# Patient Record
Sex: Female | Born: 1950
Health system: Southern US, Community
[De-identification: ages and names within clinical notes are randomized; demographics above are authoritative.]

## PROBLEM LIST (undated history)

## (undated) DIAGNOSIS — K219 Gastro-esophageal reflux disease without esophagitis: Secondary | ICD-10-CM

## (undated) DIAGNOSIS — D649 Anemia, unspecified: Secondary | ICD-10-CM

## (undated) HISTORY — PX: CHOLECYSTECTOMY: SHX55

## (undated) HISTORY — PX: TONSILLECTOMY: SUR1361

## (undated) HISTORY — DX: Gastro-esophageal reflux disease without esophagitis: K21.9

## (undated) HISTORY — PX: APPENDECTOMY: SHX54

## (undated) HISTORY — PX: FUNCTIONAL ENDOSCOPIC SINUS SURGERY: SUR616

## (undated) HISTORY — DX: Anemia, unspecified: D64.9

---

## 1998-07-18 ENCOUNTER — Other Ambulatory Visit: Admission: RE | Admit: 1998-07-18 | Discharge: 1998-07-18 | Payer: Self-pay | Admitting: *Deleted

## 1999-06-30 ENCOUNTER — Other Ambulatory Visit: Admission: RE | Admit: 1999-06-30 | Discharge: 1999-06-30 | Payer: Self-pay | Admitting: *Deleted

## 2000-05-20 ENCOUNTER — Other Ambulatory Visit: Admission: RE | Admit: 2000-05-20 | Discharge: 2000-05-20 | Payer: Self-pay | Admitting: *Deleted

## 2000-06-13 ENCOUNTER — Encounter: Payer: Self-pay | Admitting: *Deleted

## 2000-06-13 ENCOUNTER — Encounter: Admission: RE | Admit: 2000-06-13 | Discharge: 2000-06-13 | Payer: Self-pay | Admitting: *Deleted

## 2001-05-19 ENCOUNTER — Encounter: Admission: RE | Admit: 2001-05-19 | Discharge: 2001-05-19 | Payer: Self-pay | Admitting: *Deleted

## 2001-05-19 ENCOUNTER — Encounter: Payer: Self-pay | Admitting: *Deleted

## 2001-06-05 ENCOUNTER — Other Ambulatory Visit: Admission: RE | Admit: 2001-06-05 | Discharge: 2001-06-05 | Payer: Self-pay | Admitting: *Deleted

## 2002-05-25 ENCOUNTER — Encounter: Admission: RE | Admit: 2002-05-25 | Discharge: 2002-05-25 | Payer: Self-pay | Admitting: *Deleted

## 2002-05-25 ENCOUNTER — Encounter: Payer: Self-pay | Admitting: *Deleted

## 2002-06-12 ENCOUNTER — Other Ambulatory Visit: Admission: RE | Admit: 2002-06-12 | Discharge: 2002-06-12 | Payer: Self-pay | Admitting: Obstetrics and Gynecology

## 2003-05-23 ENCOUNTER — Encounter: Payer: Self-pay | Admitting: Obstetrics and Gynecology

## 2003-05-23 ENCOUNTER — Encounter: Admission: RE | Admit: 2003-05-23 | Discharge: 2003-05-23 | Payer: Self-pay | Admitting: Obstetrics and Gynecology

## 2003-07-10 ENCOUNTER — Other Ambulatory Visit: Admission: RE | Admit: 2003-07-10 | Discharge: 2003-07-10 | Payer: Self-pay | Admitting: Obstetrics and Gynecology

## 2004-05-26 ENCOUNTER — Encounter: Admission: RE | Admit: 2004-05-26 | Discharge: 2004-05-26 | Payer: Self-pay | Admitting: Obstetrics and Gynecology

## 2005-06-14 ENCOUNTER — Encounter: Admission: RE | Admit: 2005-06-14 | Discharge: 2005-06-14 | Payer: Self-pay | Admitting: Obstetrics and Gynecology

## 2006-05-02 ENCOUNTER — Encounter: Admission: RE | Admit: 2006-05-02 | Discharge: 2006-05-02 | Payer: Self-pay | Admitting: Obstetrics and Gynecology

## 2006-06-15 ENCOUNTER — Encounter: Admission: RE | Admit: 2006-06-15 | Discharge: 2006-06-15 | Payer: Self-pay | Admitting: Obstetrics and Gynecology

## 2006-07-12 ENCOUNTER — Ambulatory Visit: Payer: Self-pay | Admitting: Internal Medicine

## 2006-07-12 LAB — CONVERTED CEMR LAB
ALT: 18 units/L (ref 0–40)
Basophils Relative: 0.8 % (ref 0.0–1.0)
Bilirubin Urine: NEGATIVE
CO2: 27 meq/L (ref 19–32)
Calcium: 9.4 mg/dL (ref 8.4–10.5)
Chloride: 105 meq/L (ref 96–112)
Chol/HDL Ratio, serum: 3.9
Cholesterol: 201 mg/dL (ref 0–200)
Creatinine, Ser: 0.8 mg/dL (ref 0.4–1.2)
Glomerular Filtration Rate, Af Am: 96 mL/min/{1.73_m2}
Glucose, Bld: 91 mg/dL (ref 70–99)
HDL: 51.8 mg/dL (ref >39.0)
Hemoglobin, Urine: NEGATIVE
Ketones, ur: NEGATIVE mg/dL
Lymphocytes Relative: 31.5 % (ref 12.0–46.0)
MCHC: 33.6 g/dL (ref 30.0–36.0)
MCV: 89 fL (ref 78.0–100.0)
Monocytes Absolute: 0.4 10*3/uL (ref 0.2–0.7)
Neutro Abs: 3.7 10*3/uL (ref 1.4–7.7)
Nitrite: NEGATIVE
Platelets: 285 10*3/uL (ref 150–400)
Specific Gravity, Urine: <=1.005 (ref 1.000–1.03)
TSH: 2.33 microintl units/mL (ref 0.35–5.50)
Total Protein, Urine: NEGATIVE mg/dL
Urine Glucose: NEGATIVE mg/dL
Urobilinogen, UA: 0.2 (ref 0.0–1.0)
VLDL: 18 mg/dL (ref 0–40)
WBC: 6.5 10*3/uL (ref 4.5–10.5)
pH: 6 (ref 5.0–8.0)

## 2006-07-19 ENCOUNTER — Ambulatory Visit: Payer: Self-pay | Admitting: Internal Medicine

## 2006-07-28 ENCOUNTER — Ambulatory Visit: Payer: Self-pay | Admitting: Internal Medicine

## 2006-08-23 ENCOUNTER — Ambulatory Visit: Payer: Self-pay | Admitting: Internal Medicine

## 2006-08-23 HISTORY — PX: COLONOSCOPY: SHX174

## 2006-09-16 ENCOUNTER — Ambulatory Visit: Payer: Self-pay | Admitting: Internal Medicine

## 2006-10-17 ENCOUNTER — Ambulatory Visit: Payer: Self-pay | Admitting: Internal Medicine

## 2007-04-14 ENCOUNTER — Encounter: Admission: RE | Admit: 2007-04-14 | Discharge: 2007-04-14 | Payer: Self-pay | Admitting: Obstetrics and Gynecology

## 2007-06-09 ENCOUNTER — Ambulatory Visit: Payer: Self-pay | Admitting: Internal Medicine

## 2007-07-20 ENCOUNTER — Ambulatory Visit: Payer: Self-pay | Admitting: Internal Medicine

## 2007-07-20 LAB — CONVERTED CEMR LAB
ALT: 18 units/L (ref 0–35)
Alkaline Phosphatase: 66 units/L (ref 39–117)
BUN: 10 mg/dL (ref 6–23)
Basophils Relative: 0.7 % (ref 0.0–1.0)
Bilirubin Urine: NEGATIVE
CO2: 29 meq/L (ref 19–32)
Calcium: 9.7 mg/dL (ref 8.4–10.5)
Creatinine, Ser: 0.7 mg/dL (ref 0.4–1.2)
GFR calc Af Amer: 112 mL/min
HDL: 56.8 mg/dL (ref >39.0)
Monocytes Relative: 5.2 % (ref 3.0–11.0)
Platelets: 302 10*3/uL (ref 150–400)
RBC: 4.39 M/uL (ref 3.87–5.11)
RDW: 12.1 % (ref 11.5–14.6)
Specific Gravity, Urine: <=1.005 (ref 1.000–1.03)
Total Bilirubin: 0.9 mg/dL (ref 0.3–1.2)
Total Protein, Urine: NEGATIVE mg/dL
Total Protein: 7.7 g/dL (ref 6.0–8.3)
Triglycerides: 117 mg/dL (ref 0–149)
Urine Glucose: NEGATIVE mg/dL
VLDL: 23 mg/dL (ref 0–40)
pH: 6.5 (ref 5.0–8.0)

## 2007-07-26 ENCOUNTER — Encounter: Payer: Self-pay | Admitting: Internal Medicine

## 2007-07-26 ENCOUNTER — Ambulatory Visit: Payer: Self-pay | Admitting: Internal Medicine

## 2007-07-26 DIAGNOSIS — M1990 Unspecified osteoarthritis, unspecified site: Secondary | ICD-10-CM | POA: Insufficient documentation

## 2007-07-26 DIAGNOSIS — L570 Actinic keratosis: Secondary | ICD-10-CM | POA: Insufficient documentation

## 2007-07-26 DIAGNOSIS — L821 Other seborrheic keratosis: Secondary | ICD-10-CM | POA: Insufficient documentation

## 2007-08-22 ENCOUNTER — Encounter: Payer: Self-pay | Admitting: Internal Medicine

## 2008-01-04 ENCOUNTER — Encounter: Payer: Self-pay | Admitting: Internal Medicine

## 2008-01-16 ENCOUNTER — Encounter: Payer: Self-pay | Admitting: Internal Medicine

## 2008-04-12 ENCOUNTER — Encounter: Admission: RE | Admit: 2008-04-12 | Discharge: 2008-04-12 | Payer: Self-pay | Admitting: Internal Medicine

## 2008-09-03 ENCOUNTER — Ambulatory Visit: Payer: Self-pay | Admitting: Internal Medicine

## 2008-09-03 LAB — CONVERTED CEMR LAB
AST: 19 units/L (ref 0–37)
Alkaline Phosphatase: 54 units/L (ref 39–117)
Basophils Absolute: 0 10*3/uL (ref 0.0–0.1)
Bilirubin Urine: NEGATIVE
Chloride: 102 meq/L (ref 96–112)
Cholesterol: 156 mg/dL (ref 0–200)
Eosinophils Absolute: 0.3 10*3/uL (ref 0.0–0.7)
Eosinophils Relative: 4.3 % (ref 0.0–5.0)
GFR calc Af Amer: 133 mL/min
GFR calc non Af Amer: 110 mL/min
Hemoglobin, Urine: NEGATIVE
LDL Cholesterol: 77 mg/dL (ref 0–99)
MCHC: 33.8 g/dL (ref 30.0–36.0)
MCV: 89.3 fL (ref 78.0–100.0)
Mucus, UA: NEGATIVE
Neutrophils Relative %: 60.6 % (ref 43.0–77.0)
Platelets: 259 10*3/uL (ref 150–400)
Potassium: 4.4 meq/L (ref 3.5–5.1)
RBC / HPF: NONE SEEN
RDW: 11.8 % (ref 11.5–14.6)
TSH: 1.92 microintl units/mL (ref 0.35–5.50)
Total Bilirubin: 0.6 mg/dL (ref 0.3–1.2)
Urine Glucose: NEGATIVE mg/dL
Urobilinogen, UA: 0.2 (ref 0.0–1.0)
VLDL: 33 mg/dL (ref 0–40)
WBC: 6.1 10*3/uL (ref 4.5–10.5)

## 2008-09-06 ENCOUNTER — Telehealth: Payer: Self-pay | Admitting: Internal Medicine

## 2008-09-09 ENCOUNTER — Encounter: Payer: Self-pay | Admitting: Internal Medicine

## 2008-09-18 ENCOUNTER — Ambulatory Visit: Payer: Self-pay | Admitting: Internal Medicine

## 2008-09-18 DIAGNOSIS — N3090 Cystitis, unspecified without hematuria: Secondary | ICD-10-CM | POA: Insufficient documentation

## 2009-03-21 ENCOUNTER — Encounter: Admission: RE | Admit: 2009-03-21 | Discharge: 2009-03-21 | Payer: Self-pay | Admitting: Obstetrics and Gynecology

## 2009-04-07 ENCOUNTER — Ambulatory Visit: Payer: Self-pay | Admitting: Internal Medicine

## 2009-04-08 LAB — CONVERTED CEMR LAB
ALT: 22 units/L (ref 0–35)
AST: 19 units/L (ref 0–37)
Alkaline Phosphatase: 57 units/L (ref 39–117)
BUN: 13 mg/dL (ref 6–23)
Basophils Absolute: 0 10*3/uL (ref 0.0–0.1)
Bilirubin Urine: NEGATIVE
Bilirubin, Direct: 0.1 mg/dL (ref 0.0–0.3)
CO2: 31 meq/L (ref 19–32)
Chloride: 106 meq/L (ref 96–112)
Creatinine, Ser: 0.7 mg/dL (ref 0.4–1.2)
Direct LDL: 139 mg/dL
Eosinophils Relative: 4.6 % (ref 0.0–5.0)
HCT: 39.3 % (ref 36.0–46.0)
Ketones, ur: NEGATIVE mg/dL
Lymphocytes Relative: 29.8 % (ref 12.0–46.0)
Lymphs Abs: 2.2 10*3/uL (ref 0.7–4.0)
Monocytes Relative: 5.9 % (ref 3.0–12.0)
Platelets: 258 10*3/uL (ref 150.0–400.0)
Potassium: 4.5 meq/L (ref 3.5–5.1)
RDW: 11.9 % (ref 11.5–14.6)
Specific Gravity, Urine: 1.025 (ref 1.000–1.030)
TSH: 2.48 microintl units/mL (ref 0.35–5.50)
Total Bilirubin: 0.6 mg/dL (ref 0.3–1.2)
Total CHOL/HDL Ratio: 3
Total Protein, Urine: NEGATIVE mg/dL
Triglycerides: 71 mg/dL (ref 0.0–149.0)
Urine Glucose: NEGATIVE mg/dL
VLDL: 14.2 mg/dL (ref 0.0–40.0)
WBC: 7.4 10*3/uL (ref 4.5–10.5)
pH: 6 (ref 5.0–8.0)

## 2009-04-09 ENCOUNTER — Ambulatory Visit: Payer: Self-pay | Admitting: Internal Medicine

## 2009-04-09 DIAGNOSIS — K219 Gastro-esophageal reflux disease without esophagitis: Secondary | ICD-10-CM

## 2009-04-14 ENCOUNTER — Telehealth: Payer: Self-pay | Admitting: Internal Medicine

## 2009-09-01 ENCOUNTER — Telehealth: Payer: Self-pay | Admitting: Internal Medicine

## 2010-01-28 ENCOUNTER — Encounter: Payer: Self-pay | Admitting: Internal Medicine

## 2010-04-17 ENCOUNTER — Encounter: Admission: RE | Admit: 2010-04-17 | Discharge: 2010-04-17 | Payer: Self-pay | Admitting: Obstetrics and Gynecology

## 2010-04-21 ENCOUNTER — Telehealth: Payer: Self-pay | Admitting: Internal Medicine

## 2010-04-21 DIAGNOSIS — R61 Generalized hyperhidrosis: Secondary | ICD-10-CM

## 2010-04-29 ENCOUNTER — Ambulatory Visit: Payer: Self-pay | Admitting: Internal Medicine

## 2010-04-29 LAB — CONVERTED CEMR LAB
ALT: 16 units/L (ref 0–35)
AST: 19 units/L (ref 0–37)
Albumin: 4.4 g/dL (ref 3.5–5.2)
Bilirubin Urine: NEGATIVE
CO2: 30 meq/L (ref 19–32)
Calcium: 9.4 mg/dL (ref 8.4–10.5)
Creatinine, Ser: 0.7 mg/dL (ref 0.4–1.2)
Eosinophils Relative: 2.5 % (ref 0.0–5.0)
FSH: 57.2 milliintl units/mL
GFR calc non Af Amer: 94.53 mL/min (ref >60)
HCT: 38.2 % (ref 36.0–46.0)
HDL: 55 mg/dL (ref >39.00)
Hemoglobin: 12.9 g/dL (ref 12.0–15.0)
Ketones, ur: NEGATIVE mg/dL
Lymphs Abs: 2.1 10*3/uL (ref 0.7–4.0)
MCV: 89.3 fL (ref 78.0–100.0)
Monocytes Absolute: 0.3 10*3/uL (ref 0.1–1.0)
Monocytes Relative: 5.2 % (ref 3.0–12.0)
Neutro Abs: 3.9 10*3/uL (ref 1.4–7.7)
RDW: 12.9 % (ref 11.5–14.6)
Sodium: 141 meq/L (ref 135–145)
TSH: 1.44 microintl units/mL (ref 0.35–5.50)
Total CHOL/HDL Ratio: 3
Triglycerides: 77 mg/dL (ref 0.0–149.0)
Urine Glucose: NEGATIVE mg/dL
Urobilinogen, UA: 0.2 (ref 0.0–1.0)
WBC: 6.6 10*3/uL (ref 4.5–10.5)

## 2010-05-05 ENCOUNTER — Ambulatory Visit: Payer: Self-pay | Admitting: Internal Medicine

## 2010-10-27 NOTE — Letter (Signed)
Summary: Immunization/Shot Record  [facility]  [address] [place], [place] [address]   Phone: [phone]  Fax: [phone]     Immunization Record for: [name] [facility]  Vaccine 1 2 3 4 5 6  HepB Hepatitis B                    DTP Diphtheria, Tetanus, Pertussis                         HIB Haemophilus influenzae Type b                 IONGEXBMWU IPV Inactivated Poliovirus             MMR Measles, Mumps, Jeanella Craze XLKGMWNUUV OZDGUYQIHK Varicella Varivax    VQQVZDGLOV FIEPPIRJJO ACZYSAYTKZ Pneumococcal           Hep A Hepatitis A   SWFUXNATFT DDUKGURKYH CWCBJSEGBT DVVOHYWVPX        Tetanus Booster Date of Last: Td 04/09/2009  Flu Shot Date of Last: 08/01/2008 Pneumovax Date of Last:  Meningococcal Vaccine Given:       Other Vaccines HPV Vaccine/ Date of Last:    Vaccine/ Date of Last:    Vaccine/ Date of Last:     [name]  TGGYIRSWNI  OEVOJJKKXF Rotavirus Vaccine/ Date of Last:    Vaccine/ Date of Last:    Vaccine/ Date of Last:     GHWEXHBZJI  [name]  RCVELFYBOF Zostavax Vaccine/ Date of Last:     [name]  BPZWCHENID  [name]  POEUMPNTIR  WERXVQMGQQ  Recommended Childhood and Adolescent Immunization Schedule [place]  2006 Vaccine Age Birth 1 mos 2 mos 4 mos 6 mos 12 mos 15 mos 18 mos 24 mos 4-6 yrs 11-12 yrs 13-14 yrs 15 yrs 16-18 yrs Hepatitis B1 HepB HepB HepB1  HepB  HepB Series Catch-Up Diphtheria, Tetanus, Pertussis2   DTaP DTaP DTaP   DTaP  DTaP Tdap  Tdap Catch-Up Haemophilus influenzae type b3   Hib Hib Hib3 Hib        Inactivated Poliovirus   IPV IPV  IPV   IPV     Measles, Mumps, Rubella4      MMR   MMR M MR MMR Catch-Up Varicella5       Varicella  Varicella  Catch-Up Meningo-coccal6           MCV4  MCV4 CatchUpV4           MPSV4 for High Risk Groups  C MCV4 for High Risk Groups Pneumo-coccal7   PCV PCV PCV PCV   PCV  Catch-Up PPV for High Risk Groups         PPV for High Risk Groups  Influenza8      Influenza (Yearly)  Influenza (Yearly) for High Risk Groups Hepatitis A9       HepA Series  This schedule indicates the recommended ages for routine administration of currently licensed childhood vaccines, as of August 27, 2004, for children through age 46 years. Any dose not administered at the recommended age should be administered at any subsequent visit when indicated and feasible. Indicates age groups that warrant special effort to administer those vaccines not previously administered. Additional vaccines may be licensed and recommended during the year. Licensed combination vaccines may be used whenever any components of the combination are indicated and other components of the vaccine are not contraindicated and if approved by the Food and Drug  Administration for that dose of the series. Providers should consult the respective ACIP statement for detailed recommendations. Clinically significant adverse events that follow immunization should be reported to the Vaccine Adverse Event Reporting System (VAERS). Guidance about how to obtain and complete a VAERS form is available at www.vaers.LAgents.no or by telephone, [phone].  The Childhood and Adolescent Immunization Schedule is approved by: Advisory Committee on Administrator http://www.wade.com/   [name] BridgeDigest.com.cy   American Academy [employer].aafp.org

## 2010-10-27 NOTE — Progress Notes (Signed)
Summary: Lab request  Phone Note Call from Patient   Caller: Patient Summary of Call: pt has appt 05-05-10.  She is scheduled to have labs drawn prior and wants FSH drawn at that time.  ok to add to lab order? Initial call taken by: Lanier Prude, [facility]),  April 21, 2010 8:21 AM  Follow-up for Phone Call        ok add [facility] Follow-up by: Tresa Garter MD,  April 21, 2010 12:58 PM  Additional Follow-up for Phone Call Additional follow up Details #1::        Can you help w/dx please? Additional Follow-up by: Lamar Sprinkles, CMA,  April 21, 2010 5:55 PM  New Problems: SWEATING (ICD-780.8)   Additional Follow-up for Phone Call Additional follow up Details #2::    780.8 Follow-up by: Tresa Garter MD,  April 21, 2010 5:58 PM  Additional Follow-up for Phone Call Additional follow up Details #3:: Details for Additional Follow-up Action Taken: Diagnosis code added to lab order for [facility]. Additional Follow-up by: Verdell Face,  April 22, 2010 8:46 AM  New Problems: SWEATING (ICD-780.8)

## 2010-10-27 NOTE — Assessment & Plan Note (Signed)
Summary: cpx/#/cd   Vital Signs:  Patient profile:   60 year old female Height:      64 inches Weight:      166 pounds BMI:     28.60 O2 Sat:      96 % on Room air Temp:     98.4 degrees F oral Pulse rate:   73 / minute Pulse rhythm:   regular Resp:     16 per minute BP sitting:   138 / 80  (left arm) Cuff size:   regular  Vitals Entered By: Lanier Prude, CMA(AAMA) (May 05, 2010 2:16 PM)  O2 Flow:  Room air CC: CPX Is Patient Diabetic? No   CC:  CPX.  History of Present Illness: The patient presents for a preventive health examination   Current Medications (verified): 1)  Ibuprofen 600 Mg  Tabs (Ibuprofen) .Marland Kitchen.. 1 Two Times A Day Prn 2)  Ranitidine Hcl 150 Mg Caps (Ranitidine Hcl) .... Bid 3)  Vitamin D3 1000 Unit  Tabs (Cholecalciferol) .Marland Kitchen.. 1 Qd 4)  Triamcinolone Acetonide 0.1 % Oint (Triamcinolone Acetonide) .... Use 1-2 Times A Day 5)  Diflucan 150 Mg Tabs (Fluconazole) .Marland Kitchen.. 1 Once Daily Prn 6)  Triamcinolone Acetonide 0.5% Cream in Eucerin Lotion 1:10 .... Apply Bid To Affected Area 7)  Diflucan 150 Mg Tabs (Fluconazole) .Marland Kitchen.. 1 Po Once Daily  Prn 8)  Promethazine Hcl 25 Mg  Tabs (Promethazine Hcl) .Marland Kitchen.. 1 By Mouth Qid Prn  Allergies (verified): 1)  ! Ceclor  Past History:  Past Medical History: Last updated: 04/09/2009 Osteoarthritis B knee injury GYN: Dr Rosemary Holms Hand dermatitis GERD  Past Surgical History: Last updated: 07/26/2007 Appendectomy Cholecystectomy  Family History: Last updated: 04/21/2009 Family History Diabetes 1st degree relative Family History Osteoporosis  Social History: Last updated: 04/21/2009 Occupation: Married Maralyn Sago got married in 2010  Review of Systems  The patient denies anorexia, fever, weight loss, weight gain, vision loss, decreased hearing, hoarseness, chest pain, syncope, dyspnea on exertion, peripheral edema, prolonged cough, headaches, hemoptysis, abdominal pain, melena, hematochezia, severe  indigestion/heartburn, hematuria, incontinence, genital sores, muscle weakness, suspicious skin lesions, transient blindness, difficulty walking, depression, unusual weight change, abnormal bleeding, enlarged lymph nodes, angioedema, and breast masses.    Physical Exam  General:  Well-developed,well-nourished,in no acute distress; alert,appropriate and cooperative throughout examination Head:  Normocephalic and atraumatic without obvious abnormalities. No apparent alopecia or balding. Eyes:  No corneal or conjunctival inflammation noted. EOMI. Perrla. Ears:  External ear exam shows no significant lesions or deformities.  Otoscopic examination reveals clear canals, tympanic membranes are intact bilaterally without bulging, retraction, inflammation or discharge. Hearing is grossly normal bilaterally. Nose:  External nasal examination shows no deformity or inflammation. Nasal mucosa are pink and moist without lesions or exudates. Mouth:  Oral mucosa and oropharynx without lesions or exudates.  Teeth in good repair. Neck:  No deformities, masses, or tenderness noted. Chest Wall:  No deformities, masses, or tenderness noted. Lungs:  Normal respiratory effort, chest expands symmetrically. Lungs are clear to auscultation, no crackles or wheezes. Heart:  Normal rate and regular rhythm. S1 and S2 normal without gallop, murmur, click, rub or other extra sounds. Abdomen:  Bowel sounds positive,abdomen soft and non-tender without masses, organomegaly or hernias noted. Msk:  No deformity or scoliosis noted of thoracic or lumbar spine.   Pulses:  R and L carotid,radial,femoral,dorsalis pedis and posterior tibial pulses are full and equal bilaterally Extremities:  No clubbing, cyanosis, edema, or deformity noted with normal full range of  motion of all joints.   Neurologic:  No cranial nerve deficits noted. Station and gait are normal. Plantar reflexes are down-going bilaterally. DTRs are symmetrical throughout.  Sensory, motor and coordinative functions appear intact. Skin:  Intact without suspicious lesions or rashes Cervical Nodes:  No lymphadenopathy noted Inguinal Nodes:  No significant adenopathy Psych:  Cognition and judgment appear intact. Alert and cooperative with normal attention span and concentration. No apparent delusions, illusions, hallucinations   Impression & Recommendations:  Problem # 1:  PHYSICAL EXAMINATION (ICD-V70.0) Assessment New Health and age related issues were discussed. Available screening tests and vaccinations were discussed as well. Healthy life style including good diet and exercise was discussed. The labs were reviewed with the patient.  Orders: EKG w/ Interpretation (93000)OK GYN q 12 months   Problem # 2:  OSTEOARTHRITIS (ICD-715.90) knees Assessment: Unchanged  Her updated medication list for this problem includes:    Ibuprofen 600 Mg Tabs (Ibuprofen) .Marland Kitchen... 1 two times a day prn  Problem # 3:  Travel to [place] Assessment: New Prescriptions and advice were given Vaccines up to date  Complete Medication List: 1)  Ibuprofen 600 Mg Tabs (Ibuprofen) .Marland Kitchen.. 1 two times a day prn 2)  Ranitidine Hcl 150 Mg Caps (Ranitidine hcl) .... Bid 3)  Vitamin D3 1000 Unit Tabs (Cholecalciferol) .Marland Kitchen.. 1 qd 4)  Diflucan 150 Mg Tabs (Fluconazole) .Marland Kitchen.. 1 once daily prn 5)  Triamcinolone Acetonide 0.5 % Crea (Triamcinolone acetonide) .... Use two times a day prn 6)  Ciprofloxacin Hcl 500 Mg Tabs (Ciprofloxacin hcl) .Marland Kitchen.. 1 by mouth bid 7)  Diflucan 150 Mg Tabs (Fluconazole) .Marland Kitchen.. 1 by mouth once daily once for yeast infection  Patient Instructions: 1)  Please schedule a follow-up appointment in 1 year. Prescriptions: CIPROFLOXACIN HCL 500 MG TABS (CIPROFLOXACIN HCL) 1 by mouth bid  #20 x 1   Entered and Authorized by:   Tresa Garter MD   Signed by:   Tresa Garter MD on 05/05/2010   Method used:   Print then Give to Patient   RxID:    [id number] DIFLUCAN 150 MG TABS (FLUCONAZOLE) 1 by mouth once daily once for yeast infection  #3 x 1   Entered and Authorized by:   Tresa Garter MD   Signed by:   Tresa Garter MD on 05/05/2010   Method used:   Print then Give to Patient   RxID:   [id number] RANITIDINE HCL 150 MG CAPS (RANITIDINE HCL) bid  #60 x 10   Entered and Authorized by:   Tresa Garter MD   Signed by:   Tresa Garter MD on 05/05/2010   Method used:   Electronically to        [facility]* (retail)       [address]       [place], [place]  [address]       Ph: [phone]       Fax: [phone]   RxID:   [id number] IBUPROFEN 600 MG  TABS (IBUPROFEN) 1 two times a day prn  #120 x 1   Entered and Authorized by:   Tresa Garter MD   Signed by:   Tresa Garter MD on 05/05/2010   Method used:   Electronically to        [facility]* (retail)       [address]       [place], [place]  [address]  Ph: [phone]       Fax: [phone]   RxID:   [id number] TRIAMCINOLONE ACETONIDE 0.5 % CREA (TRIAMCINOLONE ACETONIDE) use two times a day prn  #45 g x 3   Entered and Authorized by:   Tresa Garter MD   Signed by:   Tresa Garter MD on 05/05/2010   Method used:   Electronically to        [facility]* (retail)       [address]       [place], [place]  [address]       Ph: [phone]       Fax: [phone]   RxID:   [id number]

## 2011-02-12 NOTE — Assessment & Plan Note (Signed)
[facility]                             PRIMARY CARE OFFICE NOTE   NAME:BUCIORNilam, [name]                       MRN:          [id number]  DATE:07/19/2006                            DOB:          [birth year]    The patient is a 60 year old female who presents for a wellness examination.   PAST MEDICAL HISTORY:  1. Bilateral knee pain, status post injury about 3 years ago; no surgery      was done.  2. History of appendectomy and cholecystectomy.   FAMILY HISTORY:  Mother is living at age of 45, has osteoporosis.  One  sister with asthma.  Daughter with type 1 diabetes.  Father died at age of  66.   SOCIAL HISTORY:  She is married with children, does not smoke.  She has been  staying at home lately.  She is a Systems analyst.   CURRENT MEDICATIONS:  Multivitamins on occasion, ibuprofen as needed.   ALLERGIES:  CEFUROXIME gave her rash.   REVIEW OF SYSTEMS:  Knee pain, not every day.  No chest pain or shortness of  breath.  No blood in the stool.  No neurologic complaints.  The rest is  negative.   PHYSICAL EXAMINATION:  Blood pressure 148/95, pulse 77, temperature 97.1.  Weight 165.  She looks well.  She is in no acute distress.  HEENT:  Moist mucosa.  NECK:  Supple.  No thyromegaly or bruit.  LUNGS:  Clear.  No wheeze or rales.  HEART:  S1 and S2.  No murmur.  No gallop.  ABDOMEN:  Soft and nontender.  No organomegaly and no masses felt.  LOWER EXTREMITIES:  Without edema.  She is alert, oriented and cooperative, denies being depressed.  SKIN:  Clear.  JOINTS:  Without deformities.   LABORATORY DATA:  On July 12, 2006, CBC normal, CMET normal, cholesterol  201, LDL 126, TSH 2.33, urinalysis normal.   ASSESSMENT AND PLAN:  1. Normal wellness examination. Age/health related issues discussed.      Healthy lifestyle discussed.  Regular gynecological care by Dr. Billy Flores.      Received a flu shot and a tetanus shot today.  We will schedule  a      colonoscopy by Dr. Juanda Flores.  Take calcium and vitamin D.  Mammogram and      bone density      scan per Dr. Billy Flores.  2. Knee pain, status post remote injury.  She will use ibuprofen as      needed.            ______________________________  Georgina Quint [name], MD      AVP/MedQ  DD:  07/20/2006  DT:  07/21/2006  Job #:  [id number]   cc:   [name], M.D.  [name]. Katrina Chance, MD

## 2011-02-12 NOTE — Assessment & Plan Note (Signed)
[facility]                                 ON-CALL NOTE   NAME:BUCIORLeola, Fiore                       MRN:          [id number]  DATE:01/05/2008                            DOB:          03/25/1951    PRIMARY CARE PHYSICIAN:  Georgina Quint. [name], M.D.   SUBJECTIVE:  The patient is a going on a trip to [place].  She had the  pharmacy fax prescriptions to the office but it was never responded to  in over a week.  She is now calling to get these before she leaves on  Sunday.  The prescriptions are Cipro, fluconazole, and generic Lomotil.   ASSESSMENT/PLAN:  Discussed this with the pharmacy. They do have these  prescriptions that were never addressed.  I went ahead and okayed one  filling with 0 refills, fluconazole, generic Lomotil, and Cipro for 10  days.     Kerby Nora, MD  Electronically Signed    AB/MedQ  DD: 01/05/2008  DT: 01/05/2008  Job #: [id number]

## 2011-06-08 ENCOUNTER — Other Ambulatory Visit: Payer: Self-pay | Admitting: Obstetrics and Gynecology

## 2011-06-08 DIAGNOSIS — Z1231 Encounter for screening mammogram for malignant neoplasm of breast: Secondary | ICD-10-CM

## 2011-06-09 ENCOUNTER — Ambulatory Visit
Admission: RE | Admit: 2011-06-09 | Discharge: 2011-06-09 | Disposition: A | Discharge status: Home / Self Care | Payer: 59 | Source: Ambulatory Visit | Attending: Obstetrics and Gynecology | Admitting: Obstetrics and Gynecology

## 2011-06-09 ENCOUNTER — Ambulatory Visit: Payer: Self-pay

## 2011-06-09 DIAGNOSIS — Z1231 Encounter for screening mammogram for malignant neoplasm of breast: Secondary | ICD-10-CM

## 2011-06-21 ENCOUNTER — Telehealth: Payer: Self-pay

## 2011-06-21 NOTE — Telephone Encounter (Signed)
[facility] Triage Call Report Triage Record Num: [id number] Operator: Micah Flesher Patient Name: [name] Call Date & Time: 06/19/2011 11:30:42AM Patient Phone: [phone] PCP: Sonda Primes Patient Gender: Female PCP Fax : [phone] Patient DOB: 02/04/1951 Practice Name: [facility] Reason for Call: Calling with poison Ivy/Oak on wrist, face, and on Left foot. Red oozing rash. Onset 06-18-11. Emergent sxs r/o per Poison [place], [place], or [place] Exposure protocol. Home care advice given. Protocol(s) Used: [employer], [place], or [facility] Exposure Recommended Outcome per Protocol: Provide Home/Self Care Reason for Outcome: Itching, burning skin Care Advice: ~ Change into clean clothes as soon as possible. Launder clothes several times before reuse to remove toxic resin. ~ Resting will help avoid overheating and sweating which will intensify the irritation. Apply cool compresses to affected area(s) for 20 minutes 4 to 6 times daily to help relieve itching and provide a topical anesthesia. ~ Calamine is appropriate if used as directed on the label or by a pharmacist. DO NOT use topical preparations with benzocaine because they can be sensitizing agents and can cause an allergic reaction. ~ ~ Call provider if there is no improvement in 5 days (symptoms can last 1-2 weeks). ~ SYMPTOM / CONDITION MANAGEMENT For symptom relief, consider nonprescription antihistamines (such as Allerest, Claritin, Zyrtec, Chlor-Trimetron, Benadryl, etc.) as directed on label or by pharmacist. Drowsiness may result, especially in geriatric patients. Non-sedating antihistamines are available without a prescription. ~ POISON IVY, OAK, AND SUMAC: * Call provider if you develop - an open oozing, painful rash, - signs of secondary infection (e.g. tenderness in the rash area, yellowish drainage, or soft yellow scabs), - fever of 101.5 F (38.6 C) or greater, - increasing involvement of surrounding skin, - or  severe discomfort. * Regardless of temperature, immunocompromised patients (such as diabetes, HIV/AIDS, chemotherapy, organ transplant, or chronic steroid use) must be evaluated by provider. ~ Itching Relief: - Avoid scratching or rubbing irritated area; may cause further irritation and secondary infection - Take cool showers or baths several times a day to relieve itching; do not use soap - If cool water alone does not relieve itching, try adding 1/2 to 1 cup baking soda to bath water - Follow with application of a bland lotion such as calamine (do not apply to the eyes or genitals). ~ 06/19/2011 12:00:04PM Page 1 of 1 [id number]

## 2011-08-29 ENCOUNTER — Other Ambulatory Visit: Payer: Self-pay | Admitting: Internal Medicine

## 2011-08-31 ENCOUNTER — Other Ambulatory Visit: Payer: Self-pay | Admitting: Internal Medicine

## 2011-09-16 ENCOUNTER — Other Ambulatory Visit: Payer: Self-pay | Admitting: *Deleted

## 2012-05-31 ENCOUNTER — Other Ambulatory Visit: Payer: Self-pay | Admitting: Obstetrics and Gynecology

## 2012-05-31 DIAGNOSIS — Z1231 Encounter for screening mammogram for malignant neoplasm of breast: Secondary | ICD-10-CM

## 2012-06-06 ENCOUNTER — Ambulatory Visit
Admission: RE | Admit: 2012-06-06 | Discharge: 2012-06-06 | Disposition: A | Discharge status: Home / Self Care | Payer: 59 | Source: Ambulatory Visit | Attending: Obstetrics and Gynecology | Admitting: Obstetrics and Gynecology

## 2012-06-06 DIAGNOSIS — Z1231 Encounter for screening mammogram for malignant neoplasm of breast: Secondary | ICD-10-CM

## 2012-06-20 ENCOUNTER — Encounter: Payer: Self-pay | Admitting: Internal Medicine

## 2012-06-20 ENCOUNTER — Ambulatory Visit (INDEPENDENT_AMBULATORY_CARE_PROVIDER_SITE_OTHER): Payer: 59 | Admitting: Internal Medicine

## 2012-06-20 ENCOUNTER — Other Ambulatory Visit (INDEPENDENT_AMBULATORY_CARE_PROVIDER_SITE_OTHER): Payer: 59

## 2012-06-20 VITALS — BP 130/90 | HR 76 | Temp 97.7°F | Resp 16 | Ht 65.0 in | Wt 159.0 lb

## 2012-06-20 DIAGNOSIS — K219 Gastro-esophageal reflux disease without esophagitis: Secondary | ICD-10-CM

## 2012-06-20 DIAGNOSIS — M1990 Unspecified osteoarthritis, unspecified site: Secondary | ICD-10-CM

## 2012-06-20 DIAGNOSIS — Z23 Encounter for immunization: Secondary | ICD-10-CM

## 2012-06-20 DIAGNOSIS — Z0000 Encounter for general adult medical examination without abnormal findings: Secondary | ICD-10-CM

## 2012-06-20 LAB — CBC WITH DIFFERENTIAL/PLATELET
Basophils Relative: 0.6 % (ref 0.0–3.0)
Eosinophils Absolute: 0.3 10*3/uL (ref 0.0–0.7)
HCT: 41.9 % (ref 36.0–46.0)
Hemoglobin: 13.6 g/dL (ref 12.0–15.0)
Lymphocytes Relative: 26.6 % (ref 12.0–46.0)
Lymphs Abs: 1.9 10*3/uL (ref 0.7–4.0)
MCHC: 32.6 g/dL (ref 30.0–36.0)
MCV: 90 fl (ref 78.0–100.0)
Monocytes Absolute: 0.5 10*3/uL (ref 0.1–1.0)
Neutro Abs: 4.4 10*3/uL (ref 1.4–7.7)
RBC: 4.66 Mil/uL (ref 3.87–5.11)

## 2012-06-20 LAB — URINALYSIS, ROUTINE W REFLEX MICROSCOPIC
Specific Gravity, Urine: <=1.005 (ref 1.000–1.030)
Total Protein, Urine: NEGATIVE
Urine Glucose: NEGATIVE

## 2012-06-20 LAB — HEPATIC FUNCTION PANEL
Alkaline Phosphatase: 65 U/L (ref 39–117)
Bilirubin, Direct: 0.1 mg/dL (ref 0.0–0.3)
Total Protein: 7.6 g/dL (ref 6.0–8.3)

## 2012-06-20 LAB — LIPID PANEL
Cholesterol: 180 mg/dL (ref 0–200)
LDL Cholesterol: 96 mg/dL (ref 0–99)
Total CHOL/HDL Ratio: 3

## 2012-06-20 LAB — BASIC METABOLIC PANEL
BUN: 10 mg/dL (ref 6–23)
Calcium: 9.6 mg/dL (ref 8.4–10.5)
Chloride: 102 mEq/L (ref 96–112)
Creatinine, Ser: 0.7 mg/dL (ref 0.4–1.2)

## 2012-06-20 MED ORDER — VITAMIN D 1000 UNITS PO TABS: 1000.0000 [IU] | ORAL_TABLET | Freq: Every day | ORAL | Status: AC

## 2012-06-20 NOTE — Assessment & Plan Note (Signed)
Chronic - sporadc

## 2012-06-20 NOTE — Assessment & Plan Note (Signed)
OTC meds 

## 2012-06-20 NOTE — Assessment & Plan Note (Signed)
We discussed age appropriate health related issues, including available/recomended screening tests and vaccinations. We discussed a need for adhering to healthy diet and exercise. Labs/EKG were reviewed/ordered. All questions were answered.   

## 2012-06-20 NOTE — Progress Notes (Signed)
  Subjective:    Patient ID: [name], female    DOB: [birth year], 61 y.o.   MRN: [id number]  HPI  She appears well, in no apparent distress.  Alert and oriented times three, pleasant and cooperative. Vital signs are as documented in vital signs section.  Wt Readings from Last 3 Encounters:  06/20/12 159 lb (72.122 kg)  05/05/10 166 lb (75.297 kg)  04/09/09 156 lb (70.761 kg)   BP Readings from Last 3 Encounters:  06/20/12 130/90  05/05/10 138/80  04/09/09 126/84       Review of Systems  Constitutional: Negative for fever, chills, diaphoresis, activity change, appetite change, fatigue and unexpected weight change.  HENT: Negative for hearing loss, ear pain, congestion, sore throat, sneezing, mouth sores, neck pain, dental problem, voice change, postnasal drip and sinus pressure.   Eyes: Negative for pain and visual disturbance.  Respiratory: Negative for cough, chest tightness, wheezing and stridor.   Cardiovascular: Negative for chest pain, palpitations and leg swelling.  Gastrointestinal: Negative for nausea, vomiting, abdominal pain, blood in stool, abdominal distention and rectal pain.  Genitourinary: Negative for dysuria, hematuria, decreased urine volume, vaginal bleeding, vaginal discharge, difficulty urinating, vaginal pain and menstrual problem.  Musculoskeletal: Negative for back pain, joint swelling and gait problem.  Skin: Negative for color change, rash and wound.  Neurological: Negative for dizziness, tremors, syncope, speech difficulty and light-headedness.  Hematological: Negative for adenopathy.  Psychiatric/Behavioral: Negative for suicidal ideas, hallucinations, behavioral problems, confusion, disturbed wake/sleep cycle, dysphoric mood and decreased concentration. The patient is not hyperactive.        Objective:   Physical Exam  Constitutional: She appears well-developed. No distress.  HENT:  Head: Normocephalic.  Right Ear: External ear  normal.  Left Ear: External ear normal.  Nose: Nose normal.  Mouth/Throat: Oropharynx is clear and moist.  Eyes: Conjunctivae normal are normal. Pupils are equal, round, and reactive to light. Right eye exhibits no discharge. Left eye exhibits no discharge.  Neck: Normal range of motion. Neck supple. No JVD present. No tracheal deviation present. No thyromegaly present.  Cardiovascular: Normal rate, regular rhythm and normal heart sounds.   Pulmonary/Chest: No stridor. No respiratory distress. She has no wheezes.  Abdominal: Soft. Bowel sounds are normal. She exhibits no distension and no mass. There is no tenderness. There is no rebound and no guarding.  Musculoskeletal: She exhibits no edema and no tenderness.  Lymphadenopathy:    She has no cervical adenopathy.  Neurological: She displays normal reflexes. No cranial nerve deficit. She exhibits normal muscle tone. Coordination normal.  Skin: No rash noted. No erythema.  Psychiatric: She has a normal mood and affect. Her behavior is normal. Judgment and thought content normal.    Lab Results  Component Value Date   WBC 6.6 04/29/2010   HGB 12.9 04/29/2010   HCT 38.2 04/29/2010   PLT 273.0 04/29/2010   GLUCOSE 90 04/29/2010   CHOL 143 04/29/2010   TRIG 77.0 04/29/2010   HDL 55.00 04/29/2010   LDLDIRECT 139.0 04/07/2009   LDLCALC 73 04/29/2010   ALT 16 04/29/2010   AST 19 04/29/2010   NA 141 04/29/2010   K 3.9 04/29/2010   CL 101 04/29/2010   CREATININE 0.7 04/29/2010   BUN 12 04/29/2010   CO2 30 04/29/2010   TSH 1.44 04/29/2010         Assessment & Plan:

## 2012-11-20 ENCOUNTER — Telehealth: Payer: Self-pay

## 2012-11-20 NOTE — Telephone Encounter (Signed)
How many days abroad? Thx

## 2012-11-20 NOTE — Telephone Encounter (Signed)
Pt called stating she will be travelling internationally x 4 weeks and is requesting Rxs for Malarone, Cipro, and Diflucan to take incase she becomes ill, please advise.

## 2012-11-21 MED ORDER — CIPROFLOXACIN HCL 500 MG PO TABS: 500.0000 mg | ORAL_TABLET | Freq: Two times a day (BID) | ORAL | Status: DC

## 2012-11-21 MED ORDER — ATOVAQUONE-PROGUANIL HCL 250-100 MG PO TABS: 1.0000 | ORAL_TABLET | Freq: Every day | ORAL | Status: DC

## 2012-11-21 MED ORDER — FLUCONAZOLE 150 MG PO TABS: 150.0000 mg | ORAL_TABLET | Freq: Once | ORAL | Status: DC

## 2012-11-21 NOTE — Telephone Encounter (Signed)
Pt will be out of the country for 4 weeks

## 2012-11-21 NOTE — Telephone Encounter (Signed)
Done. Pt informed.

## 2012-12-14 ENCOUNTER — Other Ambulatory Visit: Payer: Self-pay | Admitting: Internal Medicine

## 2013-01-19 ENCOUNTER — Other Ambulatory Visit: Payer: Self-pay | Admitting: Internal Medicine

## 2013-08-02 ENCOUNTER — Other Ambulatory Visit: Payer: Self-pay

## 2013-11-18 ENCOUNTER — Other Ambulatory Visit: Payer: Self-pay | Admitting: Internal Medicine

## 2014-03-08 ENCOUNTER — Other Ambulatory Visit: Payer: Self-pay | Admitting: Internal Medicine

## 2014-09-18 ENCOUNTER — Other Ambulatory Visit: Payer: Self-pay | Admitting: Internal Medicine

## 2015-05-21 ENCOUNTER — Encounter: Payer: Self-pay | Admitting: Internal Medicine

## 2015-05-21 ENCOUNTER — Ambulatory Visit (INDEPENDENT_AMBULATORY_CARE_PROVIDER_SITE_OTHER): Payer: 59 | Admitting: Internal Medicine

## 2015-05-21 VITALS — BP 178/88 | HR 83 | Wt 162.0 lb

## 2015-05-21 DIAGNOSIS — S0003XA Contusion of scalp, initial encounter: Secondary | ICD-10-CM | POA: Insufficient documentation

## 2015-05-21 DIAGNOSIS — Z7189 Other specified counseling: Secondary | ICD-10-CM

## 2015-05-21 DIAGNOSIS — Z7184 Encounter for health counseling related to travel: Secondary | ICD-10-CM | POA: Insufficient documentation

## 2015-05-21 MED ORDER — FLUCONAZOLE 150 MG PO TABS: ORAL_TABLET | ORAL | Status: DC

## 2015-05-21 MED ORDER — IBUPROFEN 600 MG PO TABS: 600.0000 mg | ORAL_TABLET | Freq: Two times a day (BID) | ORAL | Status: DC | PRN

## 2015-05-21 MED ORDER — ATOVAQUONE-PROGUANIL HCL 250-100 MG PO TABS: ORAL_TABLET | ORAL | Status: DC

## 2015-05-21 MED ORDER — CIPROFLOXACIN HCL 500 MG PO TABS: ORAL_TABLET | ORAL | Status: DC

## 2015-05-21 NOTE — Progress Notes (Signed)
Subjective:  Patient ID: [name], female    DOB: 1951-07-10  Age: 64 y.o. MRN: [id number]  CC: Head Laceration   HPI [name] presents for a head bump check. [name] pulled a weed last night, it snapped and [name] lost her foot and bumped her head against a tree - she bled some. No LOC, no HA, no n/v, no confusion.  Outpatient Prescriptions Prior to Visit  Medication Sig Dispense Refill  . atovaquone-proguanil (MALARONE) 250-100 MG TABS TAKE 1 TABLET BY MOUTH DAILY. (Patient taking differently: TAKE 1 -2 TABLET BY MOUTH BEFORE TRIP, DURING THE TRIP AND 7 DAYS AFTER TRIP.) 35 tablet 0  . ciprofloxacin (CIPRO) 500 MG tablet TAKE 1 TABLET (500 MG TOTAL) BY MOUTH 2 (TWO) TIMES DAILY. 20 tablet 0  . fluconazole (DIFLUCAN) 150 MG tablet TAKE 1 TABLET (150 MG TOTAL) BY MOUTH ONCE. 3 tablet 0  . ibuprofen (ADVIL,MOTRIN) 600 MG tablet TAKE 1 TABLET BY MOUTH TWICE A DAY AS NEEDED 120 tablet 0  . ranitidine (ZANTAC) 150 MG capsule TAKE ONE CAPSULE TWICE A DAY 60 capsule 0  . triamcinolone cream (KENALOG) 0.5 % USE TWO TIMES A DAY AS NEEDED 45 g 0  . atovaquone-proguanil (MALARONE) 250-100 MG TABS TAKE 1 TABLET BY MOUTH DAILY. 35 tablet 0   No facility-administered medications prior to visit.    ROS Review of Systems  Constitutional: Negative for fever.  Gastrointestinal: Negative for nausea and vomiting.  Musculoskeletal: Negative for back pain and neck pain.  Skin: Positive for wound.  Neurological: Negative for dizziness, facial asymmetry, weakness, light-headedness and headaches.    Objective:  BP 178/88 mmHg  Pulse 83  Wt 162 lb (73.483 kg)  SpO2 95%  BP Readings from Last 3 Encounters:  05/21/15 178/88  06/20/12 130/90  05/05/10 138/80    Wt Readings from Last 3 Encounters:  05/21/15 162 lb (73.483 kg)  06/20/12 159 lb (72.122 kg)  05/05/10 166 lb (75.297 kg)    Physical Exam  Constitutional: No distress.  Eyes: EOM are normal. Pupils are equal,  round, and reactive to light. Left eye exhibits no discharge.  Musculoskeletal: She exhibits tenderness.  Neurological: No cranial nerve deficit. Coordination normal.  Skin: No erythema.  post occip grape size hematoma w/a bloody scab. No bleeding. Neck NT  Lab Results  Component Value Date   WBC 7.0 06/20/2012   HGB 13.6 06/20/2012   HCT 41.9 06/20/2012   PLT 251.0 06/20/2012   GLUCOSE 92 06/20/2012   CHOL 180 06/20/2012   TRIG 164.0* 06/20/2012   HDL 51.70 06/20/2012   LDLDIRECT 139.0 04/07/2009   LDLCALC 96 06/20/2012   ALT 18 06/20/2012   AST 20 06/20/2012   NA 143 06/20/2012   K 4.0 06/20/2012   CL 102 06/20/2012   CREATININE 0.7 06/20/2012   BUN 10 06/20/2012   CO2 27 06/20/2012   TSH 2.36 06/20/2012    Mm Digital Screening  06/06/2012   *RADIOLOGY REPORT*  Clinical Data: Screening.  DIGITAL BILATERAL SCREENING MAMMOGRAM WITH CAD  Comparison:  Previous exams.  Findings:  There are scattered fibroglandular densities. No suspicious masses, architectural distortion, or calcifications are present.  Images were processed with CAD.  IMPRESSION: No mammographic evidence of malignancy.  A result letter of this screening mammogram will be mailed directly to the patient.  RECOMMENDATION: Screening mammogram in one year. (Code:SM-B-01Y)  BI-RADS CATEGORY 1:  Negative.   Original Report Authenticated By: Altamese Cabal, M.D.    Assessment &  Plan:   There are no diagnoses linked to this encounter. I am having Ms. Lieb maintain her ranitidine, ibuprofen, triamcinolone cream, ciprofloxacin, atovaquone-proguanil, and fluconazole.  No orders of the defined types were placed in this encounter.     Follow-up: No Follow-up on file.  Walker Kehr, MD

## 2015-05-21 NOTE — Assessment & Plan Note (Signed)
meds renewed

## 2015-05-21 NOTE — Progress Notes (Signed)
Pre visit review using our clinic review tool, if applicable. No additional management support is needed unless otherwise documented below in the visit note. 

## 2015-05-21 NOTE — Assessment & Plan Note (Signed)
05/20/15 bleeding has stopped. No concussion sx's DT up to date Keep clean Call or go to ER if issues

## 2015-06-10 ENCOUNTER — Telehealth: Payer: Self-pay | Admitting: Internal Medicine

## 2015-06-10 NOTE — Telephone Encounter (Signed)
Patient called in to advise that her head wound has improved, but she has been having a persistent headache ever since. She reports that she has not had any other symptoms, but believes that she might have a concussion. She is looking for advice, or possibly a referral?   I am not sure if it is a normal thing with this patient, but she did ramble and repeated herself about 10 times.   Please advise patient.

## 2015-06-10 NOTE — Telephone Encounter (Signed)
pls sch OV Thx 

## 2015-06-11 ENCOUNTER — Ambulatory Visit (INDEPENDENT_AMBULATORY_CARE_PROVIDER_SITE_OTHER): Payer: 59 | Admitting: Internal Medicine

## 2015-06-11 ENCOUNTER — Encounter: Payer: Self-pay | Admitting: Internal Medicine

## 2015-06-11 VITALS — BP 170/110 | HR 95 | Temp 98.5°F | Wt 160.0 lb

## 2015-06-11 DIAGNOSIS — R519 Headache, unspecified: Secondary | ICD-10-CM | POA: Insufficient documentation

## 2015-06-11 DIAGNOSIS — G4489 Other headache syndrome: Secondary | ICD-10-CM | POA: Diagnosis not present

## 2015-06-11 DIAGNOSIS — S0003XS Contusion of scalp, sequela: Secondary | ICD-10-CM | POA: Diagnosis not present

## 2015-06-11 DIAGNOSIS — R51 Headache: Secondary | ICD-10-CM

## 2015-06-11 NOTE — Assessment & Plan Note (Signed)
CT head 

## 2015-06-11 NOTE — Progress Notes (Signed)
Pre visit review using our clinic review tool, if applicable. No additional management support is needed unless otherwise documented below in the visit note. 

## 2015-06-11 NOTE — Progress Notes (Signed)
Subjective:  Patient ID: [name], female    DOB: 05-Jul-1951  Age: 64 y.o. MRN: [id number]  CC: Headache   HPI [name] presents for HAs started again. Pt had a head injury 3 weeks ado  Outpatient Prescriptions Prior to Visit  Medication Sig Dispense Refill  . atovaquone-proguanil (MALARONE) 250-100 MG TABS TAKE 1 -2 TABLET BY MOUTH BEFORE TRIP, DURING THE TRIP AND 7 DAYS AFTER TRIP. 35 tablet 0  . ciprofloxacin (CIPRO) 500 MG tablet TAKE 1 TABLET (500 MG TOTAL) BY MOUTH 2 (TWO) TIMES DAILY. 20 tablet 0  . fluconazole (DIFLUCAN) 150 MG tablet TAKE 1 TABLET (150 MG TOTAL) BY MOUTH ONCE. 3 tablet 0  . ibuprofen (ADVIL,MOTRIN) 600 MG tablet Take 1 tablet (600 mg total) by mouth 2 (two) times daily as needed. 120 tablet 0  . ranitidine (ZANTAC) 150 MG capsule TAKE ONE CAPSULE TWICE A DAY 60 capsule 0  . triamcinolone cream (KENALOG) 0.5 % USE TWO TIMES A DAY AS NEEDED 45 g 0   No facility-administered medications prior to visit.    ROS Review of Systems  Constitutional: Negative for chills, activity change, appetite change, fatigue and unexpected weight change.  HENT: Negative for congestion, mouth sores and sinus pressure.   Eyes: Negative for visual disturbance.  Respiratory: Negative for cough and chest tightness.   Gastrointestinal: Negative for nausea and abdominal pain.  Genitourinary: Negative for frequency, difficulty urinating and vaginal pain.  Musculoskeletal: Negative for back pain and gait problem.  Skin: Negative for pallor and rash.  Neurological: Positive for headaches. Negative for dizziness, tremors, weakness and numbness.  Psychiatric/Behavioral: Negative for confusion and sleep disturbance.    Objective:  BP 170/110 mmHg  Pulse 95  Temp(Src) 98.5 F (36.9 C) (Oral)  Wt 160 lb (72.576 kg)  SpO2 96%  BP Readings from Last 3 Encounters:  06/11/15 170/110  05/21/15 178/88  06/20/12 130/90    Wt Readings from Last 3 Encounters:    06/11/15 160 lb (72.576 kg)  05/21/15 162 lb (73.483 kg)  06/20/12 159 lb (72.122 kg)    Physical Exam  Constitutional: She appears well-developed. No distress.  HENT:  Head: Normocephalic.  Right Ear: External ear normal.  Left Ear: External ear normal.  Nose: Nose normal.  Mouth/Throat: Oropharynx is clear and moist.  Eyes: Conjunctivae are normal. Pupils are equal, round, and reactive to light. Right eye exhibits no discharge. Left eye exhibits no discharge.  Neck: Normal range of motion. Neck supple. No JVD present. No tracheal deviation present. No thyromegaly present.  Cardiovascular: Normal rate, regular rhythm and normal heart sounds.   Pulmonary/Chest: No stridor. No respiratory distress. She has no wheezes.  Abdominal: Soft. Bowel sounds are normal. She exhibits no distension and no mass. There is no tenderness. There is no rebound and no guarding.  Musculoskeletal: She exhibits no edema or tenderness.  Lymphadenopathy:    She has no cervical adenopathy.  Neurological: She displays normal reflexes. No cranial nerve deficit. She exhibits normal muscle tone. Coordination normal.  Skin: No rash noted. No erythema.  Psychiatric: She has a normal mood and affect. Her behavior is normal. Judgment and thought content normal.   L occip area dry blood caked to hair 3x1.5 cm Lab Results  Component Value Date   WBC 7.0 06/20/2012   HGB 13.6 06/20/2012   HCT 41.9 06/20/2012   PLT 251.0 06/20/2012   GLUCOSE 92 06/20/2012   CHOL 180 06/20/2012   TRIG 164.0* 06/20/2012  HDL 51.70 06/20/2012   LDLDIRECT 139.0 04/07/2009   LDLCALC 96 06/20/2012   ALT 18 06/20/2012   AST 20 06/20/2012   NA 143 06/20/2012   K 4.0 06/20/2012   CL 102 06/20/2012   CREATININE 0.7 06/20/2012   BUN 10 06/20/2012   CO2 27 06/20/2012   TSH 2.36 06/20/2012    Mm Digital Screening  06/06/2012   *RADIOLOGY REPORT*  Clinical Data: Screening.  DIGITAL BILATERAL SCREENING MAMMOGRAM WITH CAD   Comparison:  Previous exams.  Findings:  There are scattered fibroglandular densities. No suspicious masses, architectural distortion, or calcifications are present.  Images were processed with CAD.  IMPRESSION: No mammographic evidence of malignancy.  A result letter of this screening mammogram will be mailed directly to the patient.  RECOMMENDATION: Screening mammogram in one year. (Code:SM-B-01Y)  BI-RADS CATEGORY 1:  Negative.   Original Report Authenticated By: Altamese Cabal, M.D.    Assessment & Plan:   [name] was seen today for headache.  Diagnoses and all orders for this visit:  Scalp hematoma, sequela -     CT Head Wo Contrast  Other headache syndrome -     CT Head Wo Contrast  I am having [name] maintain her ranitidine, triamcinolone cream, atovaquone-proguanil, ciprofloxacin, fluconazole, and ibuprofen.  No orders of the defined types were placed in this encounter.     Follow-up: No Follow-up on file.  Katrina Kehr, MD

## 2015-06-11 NOTE — Assessment & Plan Note (Signed)
9/16 post-head injury CT Exedrin prn

## 2015-06-11 NOTE — Telephone Encounter (Signed)
Pt seeing Dr. Camila Li

## 2015-06-12 ENCOUNTER — Telehealth: Payer: Self-pay

## 2015-06-12 ENCOUNTER — Ambulatory Visit (INDEPENDENT_AMBULATORY_CARE_PROVIDER_SITE_OTHER)
Admission: RE | Admit: 2015-06-12 | Discharge: 2015-06-12 | Disposition: A | Discharge status: Home / Self Care | Payer: 59 | Source: Ambulatory Visit | Attending: Internal Medicine | Admitting: Internal Medicine

## 2015-06-12 ENCOUNTER — Ambulatory Visit: Payer: 59 | Admitting: Internal Medicine

## 2015-06-12 DIAGNOSIS — S0003XS Contusion of scalp, sequela: Secondary | ICD-10-CM

## 2015-06-12 DIAGNOSIS — G4489 Other headache syndrome: Secondary | ICD-10-CM | POA: Diagnosis not present

## 2015-06-12 NOTE — Telephone Encounter (Signed)
Stacey from [facility] called to inform you this pt CT scan WO contrast is up. She did state that it was not a critical. Routing for your FYI. Reconfirmed message with Erline Levine verbal readback.

## 2015-06-13 NOTE — Telephone Encounter (Signed)
MD has reviewed CT results and advised pt via MyChart message.

## 2015-06-19 ENCOUNTER — Encounter: Payer: Self-pay | Admitting: Internal Medicine

## 2015-07-15 ENCOUNTER — Encounter: Payer: Self-pay | Admitting: Internal Medicine

## 2015-07-15 ENCOUNTER — Ambulatory Visit (INDEPENDENT_AMBULATORY_CARE_PROVIDER_SITE_OTHER): Payer: 59 | Admitting: Internal Medicine

## 2015-07-15 VITALS — BP 194/106 | HR 70 | Temp 97.8°F | Resp 18 | Wt 158.0 lb

## 2015-07-15 DIAGNOSIS — G44309 Post-traumatic headache, unspecified, not intractable: Secondary | ICD-10-CM | POA: Diagnosis not present

## 2015-07-15 NOTE — Progress Notes (Signed)
Subjective:    Patient ID: [name], female    DOB: [birth year], 64 y.o.   MRN: [id number]  HPI  She is here today for persistent headaches and neck pain after head trauma a couple of months ago.  In August she was pulling honeysuckle trees and she was hit in the head with a branch.  She had a clip in her hair and had a scalp hematoma and laceration.  She had whole head pain following that as well as local pain.  She has had headaches since then, but they are intermittent and less intense.  She had a ct scan of her head 9/15 and there was posterior scalp swelling, no intracranial abnormality. She continues to take ibuprofen and apply cold compresses.   The headaches come on when riding in her husbands stick shift car, bending over and sometimes occur randomly.  She is unsure if this is normal.    He denies nausea, blurry vision, photophobia dizziness, lightheadedness, confusion and fogginess. She still has some posterior neck pain on the left since the accident.  Elevated blood pressure:  Her BP at home was 118/78 recently.  The highest she has seen at home was 145/90.  It is typically 120-130/70's.  She states her blood pressure has always been well controlled up until recently.  Medications and allergies reviewed with patient and updated if appropriate.  Patient Active Problem List   Diagnosis Date Noted  . Headache 06/11/2015  . Scalp hematoma 05/21/2015  . Travel advice encounter 05/21/2015  . Well adult exam 06/20/2012  . SWEATING 04/21/2010  . GERD 04/09/2009  . CYSTITIS 09/18/2008  . ACTINIC KERATOSIS 07/26/2007  . KERATOSIS, SEBORRHEIC [place] 07/26/2007  . OSTEOARTHRITIS 07/26/2007    Past Medical History  Diagnosis Date  . GERD (gastroesophageal reflux disease)     No past surgical history on file.  Social History   Social History  . Marital Status: Married    Spouse Name: N/A  . Number of Children: N/A  . Years of Education: N/A   Social History Main  Topics  . Smoking status: Never Smoker   . Smokeless tobacco: None  . Alcohol Use: No  . Drug Use: No  . Sexual Activity: Yes   Other Topics Concern  . None   Social History Narrative    Review of Systems  Constitutional: Negative for fever and chills.  Eyes: Negative for photophobia and visual disturbance.  Gastrointestinal: Positive for nausea (with headache).  Musculoskeletal: Positive for neck pain.  Neurological: Negative for dizziness, weakness, light-headedness and numbness.  Psychiatric/Behavioral: Negative for confusion.       No foggy feeling       Objective:   Filed Vitals:   07/15/15 1016  BP: 194/106  Pulse: 70  Temp: 97.8 F (36.6 C)  Resp: 18   Filed Weights   07/15/15 1016  Weight: 158 lb (71.668 kg)   Body mass index is 26.29 kg/(m^2).   Physical Exam  Constitutional: She is oriented to person, place, and time. She appears well-developed and well-nourished. No distress.  HENT:  Head: Normocephalic.  Scalp with healed laceration in posterior right head, mild tenderness, no erythema or discharge  Eyes: Conjunctivae and EOM are normal.  Neck: Neck supple.  Musculoskeletal: She exhibits no edema.  Mild tenderness left posterior neck - muscular,no palpable spasm  Neurological: She is alert and oriented to person, place, and time. No cranial nerve deficit. Coordination normal.  Psychiatric: She has a normal  mood and affect. Her behavior is normal.        Assessment & Plan:   Headaches, left-sided posterior neck pain Related to head trauma in August CT done in September, which showed no intracranial abnormality Headaches and neck pain improving Normal neurological exam No concerning symptoms Continue ibuprofen and symptomatic treatment Avoid activities that cause headaches Advised her to limit ibuprofen due to the possibility of rebound headaches Heat/cold neck or topical medications  Elevated bp Blood pressure has been elevated recently  in the office- she may be developing white coat hypertension or the elevation is related to ibuprofen use She has been controlled Continue to monitor at home and return if it remains elevated Limit ibuprofen-discussed this can cause hypertension  Flu vaccine today

## 2015-07-15 NOTE — Patient Instructions (Addendum)
Continue to take ibuprofen only as needed for the headache - try to keep it to a minimum.  Listen to your body - if something causes a headache then avoid doing that activity.  Apply heat, cold and topical medications to the neck pain - this is likely a muscle strain.  You received a flu vaccine today.

## 2015-07-15 NOTE — Progress Notes (Signed)
Pre visit review using our clinic review tool, if applicable. No additional management support is needed unless otherwise documented below in the visit note. 

## 2015-07-18 ENCOUNTER — Telehealth: Payer: Self-pay | Admitting: Internal Medicine

## 2015-07-18 NOTE — Telephone Encounter (Signed)
Patient states she came in on Tuesday and seen Dr. Quay Burow.  She would like to know if she will be okay to fly. Patient is suppose to be flying out for a baby shower in [place].  She does not want to fly if its going to make her headaches worse.  Patient also would like to know if she needs to come back in to see Dr. Alain Marion.  Patient states children thinks she needs to have additional testing because she is still having headaches.  Patient states she stills has nausea.  Patient states she is taking bp it is running 122/ over 80 something.

## 2015-07-20 ENCOUNTER — Other Ambulatory Visit: Payer: Self-pay | Admitting: Internal Medicine

## 2015-07-21 NOTE — Telephone Encounter (Signed)
Patient is calling back regarding note below °

## 2015-07-21 NOTE — Telephone Encounter (Signed)
Pt informed of below. She wants to know if you can work her in tomorrow 07/22/15. Please advise.

## 2015-07-21 NOTE — Telephone Encounter (Signed)
I think it is ok to fly. Take Ibuprofen prior to flying I will be happy to see [name] for an OV again Thx

## 2015-07-22 ENCOUNTER — Ambulatory Visit (INDEPENDENT_AMBULATORY_CARE_PROVIDER_SITE_OTHER): Payer: 59 | Admitting: Internal Medicine

## 2015-07-22 ENCOUNTER — Encounter: Payer: Self-pay | Admitting: Internal Medicine

## 2015-07-22 ENCOUNTER — Telehealth: Payer: Self-pay | Admitting: Internal Medicine

## 2015-07-22 VITALS — BP 180/90 | HR 77 | Wt 156.0 lb

## 2015-07-22 DIAGNOSIS — S0003XS Contusion of scalp, sequela: Secondary | ICD-10-CM

## 2015-07-22 DIAGNOSIS — F4321 Adjustment disorder with depressed mood: Secondary | ICD-10-CM

## 2015-07-22 DIAGNOSIS — IMO0001 Reserved for inherently not codable concepts without codable children: Secondary | ICD-10-CM

## 2015-07-22 DIAGNOSIS — R030 Elevated blood-pressure reading, without diagnosis of hypertension: Secondary | ICD-10-CM

## 2015-07-22 DIAGNOSIS — G4459 Other complicated headache syndrome: Secondary | ICD-10-CM

## 2015-07-22 MED ORDER — VERAPAMIL HCL ER 100 MG PO CP24: 100.0000 mg | ORAL_CAPSULE | Freq: Every day | ORAL | Status: DC

## 2015-07-22 NOTE — Assessment & Plan Note (Addendum)
9/16 post-head injury (concussion). CT was ok Neurol ref    Pt states her BP is ok most of the time at home: SBP 118-145 and DBP 78-92. Pt i c/o HA. [name]'s mom died in 06-25-2023 - she is stressed. Start on Verapamil low dose

## 2015-07-22 NOTE — Telephone Encounter (Signed)
It requesting more information ahead of time on referral Dr. Alain Marion has entered to know ahead of time where she will be going.  Is also requesting to get in as soon as possible because she is leaving for [facility] soon.

## 2015-07-22 NOTE — Assessment & Plan Note (Signed)
[name]'s mother died Sept 2016 Discussed. She is ok

## 2015-07-22 NOTE — Telephone Encounter (Signed)
Working in with PCP today at 11:45am per PCP.

## 2015-07-22 NOTE — Assessment & Plan Note (Signed)
Resolved

## 2015-07-22 NOTE — Assessment & Plan Note (Signed)
2016 Pt states her BP is ok most of the time at home: SBP 118-145 and DBP 78-92. Pt i c/o HA. [name]'s mom died in 06-Jun-2023 - she is stressed.Verapamil low dose.

## 2015-07-22 NOTE — Patient Instructions (Signed)
Post-Concussion Syndrome  Post-concussion syndrome describes the symptoms that can occur after a head injury. These symptoms can last from weeks to months.  CAUSES   It is not clear why some head injuries cause post-concussion syndrome. It can occur whether your head injury was mild or severe and whether you were wearing head protection or not.   SIGNS AND SYMPTOMS  · Memory difficulties.  · Dizziness.  · Headaches.  · Double vision or blurry vision.  · Sensitivity to light.  · Hearing difficulties.  · Depression.  · Tiredness.  · Weakness.  · Difficulty with concentration.  · Difficulty sleeping or staying asleep.  · Vomiting.  · Poor balance or instability on your feet.  · Slow reaction time.  · Difficulty learning and remembering things you have heard.  DIAGNOSIS   There is no test to determine whether you have post-concussion syndrome. Your health care provider may order an imaging scan of your brain, such as a CT scan, to check for other problems that may be causing your symptoms (such as a severe injury inside your skull).  TREATMENT   Usually, these problems disappear over time without medical care. Your health care provider may prescribe medicine to help ease your symptoms. It is important to follow up with a neurologist to evaluate your recovery and address any lingering symptoms or issues.  HOME CARE INSTRUCTIONS   · Take medicines only as directed by your health care provider. Do not take aspirin. Aspirin can slow blood clotting.  · Sleep with your head slightly elevated to help with headaches.  · Avoid any situation where there is potential for another head injury. This includes football, hockey, soccer, basketball, martial arts, downhill snow sports, and horseback riding. Your condition will get worse every time you experience a concussion. You should avoid these activities until you are evaluated by the appropriate follow-up health care providers.  · Keep all follow-up visits as directed by your health  care provider. This is important.  SEEK MEDICAL CARE IF:  · You have increased problems paying attention or concentrating.  · You have increased difficulty remembering or learning new information.  · You need more time to complete tasks or assignments than before.  · You have increased irritability or decreased ability to cope with stress.  · You have more symptoms than before.  Seek medical care if you have any of the following symptoms for more than two weeks after your injury:  · Lasting (chronic) headaches.  · Dizziness or balance problems.  · Nausea.  · Vision problems.  · Increased sensitivity to noise or light.  · Depression or mood swings.  · Anxiety or irritability.  · Memory problems.  · Difficulty concentrating or paying attention.  · Sleep problems.  · Feeling tired all the time.  SEEK IMMEDIATE MEDICAL CARE IF:  · You have confusion or unusual drowsiness.  · Others find it difficult to wake you up.  · You have nausea or persistent, forceful vomiting.  · You feel like you are moving when you are not (vertigo). Your eyes may move rapidly back and forth.  · You have convulsions or faint.  · You have severe, persistent headaches that are not relieved by medicine.  · You cannot use your arms or legs normally.  · One of your pupils is larger than the other.  · You have clear or bloody discharge from your nose or ears.  · Your problems are getting worse, not better.  MAKE   SURE YOU:  · Understand these instructions.  · Will watch your condition.  · Will get help right away if you are not doing well or get worse.     This information is not intended to replace advice given to you by your health care provider. Make sure you discuss any questions you have with your health care provider.     Document Released: 03/05/2002 Document Revised: 10/04/2014 Document Reviewed: 12/19/2013  [employer]vier Interactive Patient Education ©2016 [employer]

## 2015-07-22 NOTE — Progress Notes (Signed)
Pre visit review using our clinic review tool, if applicable. No additional management support is needed unless otherwise documented below in the visit note. 

## 2015-07-22 NOTE — Progress Notes (Signed)
Subjective:  Patient ID: [name], female    DOB: 1951-08-05  Age: 64 y.o. MRN: [id number]  CC: No chief complaint on file.   HPI [name] presents for HAs and not feeling well. Pt states her BP is ok most of the time at home: SBP 118-145 and DBP 78-92. Pt i c/o HA. [name]'s mom died in 2023/06/11 - she is stressed.  Outpatient Prescriptions Prior to Visit  Medication Sig Dispense Refill  . atovaquone-proguanil (MALARONE) 250-100 MG TABS tablet TAKE 1 -2 TABLET BY MOUTH BEFORE TRIP, DURING THE TRIP AND 7 DAYS AFTER TRIP. 35 tablet 0  . ciprofloxacin (CIPRO) 500 MG tablet TAKE 1 TABLET BY MOUTH TWICE A DAY 20 tablet 0  . fluconazole (DIFLUCAN) 150 MG tablet TAKE 1 TABLET (150 MG TOTAL) BY MOUTH ONCE. 3 tablet 0  . ibuprofen (ADVIL,MOTRIN) 600 MG tablet Take 1 tablet (600 mg total) by mouth 2 (two) times daily as needed. 120 tablet 0  . triamcinolone cream (KENALOG) 0.5 % USE TWO TIMES A DAY AS NEEDED 45 g 0   No facility-administered medications prior to visit.    ROS Review of Systems  Constitutional: Negative for chills, activity change, appetite change, fatigue and unexpected weight change.  HENT: Negative for congestion, mouth sores and sinus pressure.   Eyes: Negative for visual disturbance.  Respiratory: Negative for cough and chest tightness.   Gastrointestinal: Negative for nausea and abdominal pain.  Genitourinary: Negative for frequency, difficulty urinating and vaginal pain.  Musculoskeletal: Negative for back pain and gait problem.  Skin: Negative for pallor and rash.  Neurological: Positive for headaches. Negative for dizziness, tremors, weakness and numbness.  Psychiatric/Behavioral: Negative for confusion and sleep disturbance. The patient is nervous/anxious.     Objective:  BP 180/90 mmHg  Pulse 77  Wt 156 lb (70.761 kg)  SpO2 97%  BP Readings from Last 3 Encounters:  07/22/15 180/90  07/15/15 194/106  06/11/15 170/110    Wt Readings  from Last 3 Encounters:  07/22/15 156 lb (70.761 kg)  07/15/15 158 lb (71.668 kg)  06/11/15 160 lb (72.576 kg)    Physical Exam  Constitutional: She appears well-developed. No distress.  HENT:  Head: Normocephalic.  Right Ear: External ear normal.  Left Ear: External ear normal.  Nose: Nose normal.  Mouth/Throat: Oropharynx is clear and moist.  Eyes: Conjunctivae are normal. Pupils are equal, round, and reactive to light. Right eye exhibits no discharge. Left eye exhibits no discharge.  Neck: Normal range of motion. Neck supple. No JVD present. No tracheal deviation present. No thyromegaly present.  Cardiovascular: Normal rate, regular rhythm and normal heart sounds.   Pulmonary/Chest: No stridor. No respiratory distress. She has no wheezes.  Abdominal: Soft. Bowel sounds are normal. She exhibits no distension and no mass. There is no tenderness. There is no rebound and no guarding.  Musculoskeletal: She exhibits tenderness. She exhibits no edema.  Lymphadenopathy:    She has no cervical adenopathy.  Neurological: She displays normal reflexes. No cranial nerve deficit. She exhibits normal muscle tone. Coordination normal.  Skin: No rash noted. No erythema.  Psychiatric: She has a normal mood and affect. Her behavior is normal. Judgment and thought content normal.  scalp is still tender  Lab Results  Component Value Date   WBC 7.0 06/20/2012   HGB 13.6 06/20/2012   HCT 41.9 06/20/2012   PLT 251.0 06/20/2012   GLUCOSE 92 06/20/2012   CHOL 180 06/20/2012   TRIG 164.0* 06/20/2012  HDL 51.70 06/20/2012   LDLDIRECT 139.0 04/07/2009   LDLCALC 96 06/20/2012   ALT 18 06/20/2012   AST 20 06/20/2012   NA 143 06/20/2012   K 4.0 06/20/2012   CL 102 06/20/2012   CREATININE 0.7 06/20/2012   BUN 10 06/20/2012   CO2 27 06/20/2012   TSH 2.36 06/20/2012    Ct Head Wo Contrast  06/12/2015  CLINICAL DATA:  Posterior scalp hematoma, occipital head trauma 05/21/2015. Persistent headache.  EXAM: CT HEAD WITHOUT CONTRAST TECHNIQUE: Contiguous axial images were obtained from the base of the skull through the vertex without intravenous contrast. COMPARISON:  None. FINDINGS: Occipital scalp swelling is evident. No subjacent skull fracture. Ethmoid mucoperiosteal thickening. Orbits are unremarkable. No acute hemorrhage, infarct, or mass lesion is identified. No midline shift. IMPRESSION: Posterior scalp swelling.  No acute intracranial abnormality. Electronically Signed   By: Conchita Paris M.D.   On: 06/12/2015 14:39    Assessment & Plan:   Diagnoses and all orders for this visit:  Other complicated headache syndrome -     Ambulatory referral to Neurology  Elevated BP  Scalp hematoma, sequela  Grief  Other orders -     verapamil (VERELAN) 100 MG 24 hr capsule; Take 1 capsule (100 mg total) by mouth at bedtime.  I am having Ms. Villalpando start on verapamil. I am also having her maintain her triamcinolone cream, ibuprofen, ciprofloxacin, fluconazole, and atovaquone-proguanil.  Meds ordered this encounter  Medications  . verapamil (VERELAN) 100 MG 24 hr capsule    Sig: Take 1 capsule (100 mg total) by mouth at bedtime.    Dispense:  30 capsule    Refill:  11     Follow-up: No Follow-up on file.  Walker Kehr, MD

## 2015-07-23 ENCOUNTER — Telehealth: Payer: Self-pay | Admitting: Internal Medicine

## 2015-07-23 NOTE — Telephone Encounter (Signed)
Patient got appointment set.  It was scheduled for 12/5th.  Patient states she spoke with Neurology and they state if Dr. Alain Marion can mark as urgent they can get patient in sooner.  Patient is requesting Dr.  Alain Marion do this.

## 2015-07-23 NOTE — Telephone Encounter (Signed)
Please advise, thanks.

## 2015-07-23 NOTE — Telephone Encounter (Signed)
Ok - pls change to "urgent" Thx

## 2015-07-23 NOTE — Telephone Encounter (Signed)
Pt not sure if she should be taking the blood pressure medicine that was prescribed to her yesterday.  Her head is still feeling hot. Should she have an antibiotic?  Can you please call her

## 2015-07-24 NOTE — Telephone Encounter (Signed)
No need for an abx If BP is >130/90 - take BP med daily Thx

## 2015-07-24 NOTE — Telephone Encounter (Signed)
Changed priority to "urgent" and sent msg to [facility].

## 2015-07-25 ENCOUNTER — Telehealth: Payer: Self-pay | Admitting: Internal Medicine

## 2015-07-25 NOTE — Telephone Encounter (Signed)
Would like to know why Dr. Camila Li wants to see her in December?

## 2015-07-25 NOTE — Telephone Encounter (Signed)
Pt informed

## 2015-07-28 NOTE — Telephone Encounter (Signed)
For a f/u Thx

## 2015-08-01 NOTE — Telephone Encounter (Signed)
Left patient vm with MD response and told her to call us back

## 2015-08-05 ENCOUNTER — Encounter: Payer: Self-pay | Admitting: Neurology

## 2015-08-05 ENCOUNTER — Ambulatory Visit (INDEPENDENT_AMBULATORY_CARE_PROVIDER_SITE_OTHER): Payer: 59 | Admitting: Neurology

## 2015-08-05 VITALS — BP 142/88 | HR 77 | Resp 16 | Wt 153.0 lb

## 2015-08-05 DIAGNOSIS — G609 Hereditary and idiopathic neuropathy, unspecified: Secondary | ICD-10-CM

## 2015-08-05 DIAGNOSIS — R030 Elevated blood-pressure reading, without diagnosis of hypertension: Secondary | ICD-10-CM | POA: Diagnosis not present

## 2015-08-05 DIAGNOSIS — IMO0001 Reserved for inherently not codable concepts without codable children: Secondary | ICD-10-CM

## 2015-08-05 DIAGNOSIS — G44329 Chronic post-traumatic headache, not intractable: Secondary | ICD-10-CM

## 2015-08-05 DIAGNOSIS — M792 Neuralgia and neuritis, unspecified: Secondary | ICD-10-CM

## 2015-08-05 NOTE — Patient Instructions (Addendum)
Neurologically, you are fine Headaches should continue to improve over time. 1.  However, you must limit use of ibuprofen to no more than 2 days out of the week 2.  If headaches persist, we can consider starting a daily preventative medication.  My suggestion would be either nortriptyline or gabapentin.  If you would like to try one, call us and we can prescribe to you with follow up. 3.  Otherwise, follow up as needed. 4. Please avoid known triggers, including quick movements and rapid start and stops, such as those felt while riding in a sports car.

## 2015-08-05 NOTE — Progress Notes (Signed)
NEUROLOGY CONSULTATION NOTE  [name] MRN: [id number] DOB: [birth year]  Referring provider: Dr. Alain Marion Primary care provider: Dr. Alain Marion  Reason for consult:  Headache  HISTORY OF PRESENT ILLNESS: [name] is a 64 year old right-handed female who presents for headache.  History obtained by patient and PCP note.  Images of head CT reviewed.  On 05/20/15, she was in the yard pulling a weed, when it snapped and she fell backwards hitting her head against a tree stump.  She developed a scalp hematoma but no loss of consciousness.  She has since had a headache.  The headaches are holocephalic, but particularly in a band-like distribution.  It is non-throbbing.  It is constant but fluctuates in intensity.  There is no associated nausea, photophobia, phonophobia or visual disturbance.  More recently, she will occasionally experience a burning sensation in her occipital region.  She has been taking ibuprofen daily.  Hot and cold compresses are most effective.  The jarring motion when riding in her husband's camaro.  Overall, she feels that her headaches are starting to improve and are more manageable.  CT of head from 06/12/15 showed posterior scalp swelling but no intracranial abnormality.   PAST MEDICAL HISTORY: Past Medical History  Diagnosis Date  . GERD (gastroesophageal reflux disease)     PAST SURGICAL HISTORY: Past Surgical History  Procedure Laterality Date  . Appendectomy    . Cholecystectomy    . Tonsillectomy      MEDICATIONS: Current Outpatient Prescriptions on File Prior to Visit  Medication Sig Dispense Refill  . ibuprofen (ADVIL,MOTRIN) 600 MG tablet Take 1 tablet (600 mg total) by mouth 2 (two) times daily as needed. 120 tablet 0  . atovaquone-proguanil (MALARONE) 250-100 MG TABS tablet TAKE 1 -2 TABLET BY MOUTH BEFORE TRIP, DURING THE TRIP AND 7 DAYS AFTER TRIP. (Patient not taking: Reported on 08/05/2015) 35 tablet 0  . ciprofloxacin  (CIPRO) 500 MG tablet TAKE 1 TABLET BY MOUTH TWICE A DAY (Patient not taking: Reported on 08/05/2015) 20 tablet 0  . fluconazole (DIFLUCAN) 150 MG tablet TAKE 1 TABLET (150 MG TOTAL) BY MOUTH ONCE. (Patient not taking: Reported on 08/05/2015) 3 tablet 0  . triamcinolone cream (KENALOG) 0.5 % USE TWO TIMES A DAY AS NEEDED (Patient not taking: Reported on 08/05/2015) 45 g 0   No current facility-administered medications on file prior to visit.    ALLERGIES: Allergies  Allergen Reactions  . Cefaclor     FAMILY HISTORY: Family History  Problem Relation Age of Onset  . Aneurysm Father     AAA  . Cancer Brother 50    eye melanoma    SOCIAL HISTORY: Social History   Social History  . Marital Status: Married    Spouse Name: N/A  . Number of Children: 5  . Years of Education: N/A   Occupational History  . Not on file.   Social History Main Topics  . Smoking status: Never Smoker   . Smokeless tobacco: Never Used  . Alcohol Use: No  . Drug Use: No  . Sexual Activity: Yes   Other Topics Concern  . Not on file   Social History Narrative    REVIEW OF SYSTEMS: Constitutional: No fevers, chills, or sweats, no generalized fatigue, change in appetite Eyes: No visual changes, double vision, eye pain Ear, nose and throat: No hearing loss, ear pain, nasal congestion, sore throat Cardiovascular: No chest pain, palpitations Respiratory:  No shortness of breath at rest or with  exertion, wheezes GastrointestinaI: No nausea, vomiting, diarrhea, abdominal pain, fecal incontinence Genitourinary:  No dysuria, urinary retention or frequency Musculoskeletal:  Neck pain Integumentary: No rash, pruritus, skin lesions Neurological: as above Psychiatric: depression Endocrine: No palpitations, fatigue, diaphoresis, mood swings, change in appetite, change in weight, increased thirst Hematologic/Lymphatic:  No anemia, purpura, petechiae. Allergic/Immunologic: no itchy/runny eyes, nasal  congestion, recent allergic reactions, rashes  PHYSICAL EXAM: Filed Vitals:   08/05/15 1239  BP: 142/88  Pulse: 77  Resp: 16   General: No acute distress.   Head:  Normocephalic/atraumatic.  Mild suboccipital tenderness to palpation. Eyes:  fundi unremarkable, without vessel changes, exudates, hemorrhages or papilledema. Neck: supple, no paraspinal tenderness, full range of motion Back: No paraspinal tenderness Heart: regular rate and rhythm Lungs: Clear to auscultation bilaterally. Vascular: No carotid bruits. Neurological Exam: Mental status: alert and oriented to person, place, and time, recent and remote memory intact, fund of knowledge intact, attention and concentration intact, speech fluent and not dysarthric, language intact. Cranial nerves: CN I: not tested CN II: pupils equal, round and reactive to light, visual fields intact, fundi unremarkable, without vessel changes, exudates, hemorrhages or papilledema. CN III, IV, VI:  full range of motion, no nystagmus, no ptosis CN V: facial sensation intact CN VII: upper and lower face symmetric CN VIII: hearing intact CN IX, X: gag intact, uvula midline CN XI: sternocleidomastoid and trapezius muscles intact CN XII: tongue midline Bulk & Tone: normal, no fasciculations. Motor:  5/5 throughout  Sensation: temperature and vibration sensation intact. Deep Tendon Reflexes:  2+ throughout, toes downgoing. Finger to nose testing:  Without dysmetria.  Heel to shin:  Without dysmetria.  Gait:  Normal station and stride.  Able to turn and tandem walk. Romberg negative.  IMPRESSION: Post-traumatic headache, complicated by medication overuse. Neuralgia involving the scalp, either related to the injury itself, or occipital neuralgia. Elevated blood pressure.  She has a history of elevated blood pressures when she sees the doctor.  PLAN: We discussed starting a preventative medication, such as nortriptyline or gabapentin.  She would  like to hold off for now. She should limit use of ibuprofen to no more than 2 days out of the week She should continue cold and hot compresses as needed Avoid riding the camaro as the jarring motions trigger headache. If she wishes to try a preventative, she will call us.  Otherwise, follow up as needed. Blood pressure should be re-evaluated by PCP.  Thank you for allowing me to take part in the care of this patient.  Metta Clines, DO  CC:  Lew Dawes, MD

## 2015-09-01 ENCOUNTER — Ambulatory Visit: Payer: 59 | Admitting: Neurology

## 2015-09-02 ENCOUNTER — Encounter: Payer: Self-pay | Admitting: Internal Medicine

## 2015-09-02 ENCOUNTER — Ambulatory Visit (INDEPENDENT_AMBULATORY_CARE_PROVIDER_SITE_OTHER): Payer: 59 | Admitting: Internal Medicine

## 2015-09-02 VITALS — BP 140/85 | HR 87 | Wt 150.0 lb

## 2015-09-02 DIAGNOSIS — Z23 Encounter for immunization: Secondary | ICD-10-CM | POA: Diagnosis not present

## 2015-09-02 DIAGNOSIS — Z0000 Encounter for general adult medical examination without abnormal findings: Secondary | ICD-10-CM | POA: Diagnosis not present

## 2015-09-02 DIAGNOSIS — G4459 Other complicated headache syndrome: Secondary | ICD-10-CM | POA: Diagnosis not present

## 2015-09-02 DIAGNOSIS — R030 Elevated blood-pressure reading, without diagnosis of hypertension: Secondary | ICD-10-CM

## 2015-09-02 DIAGNOSIS — IMO0001 Reserved for inherently not codable concepts without codable children: Secondary | ICD-10-CM

## 2015-09-02 NOTE — Assessment & Plan Note (Signed)
We discussed age appropriate health related issues, including available/recomended screening tests and vaccinations. We discussed a need for adhering to healthy diet and exercise. Labs/EKG were reviewed/ordered. All questions were answered. Colon at 64 yo tDAP

## 2015-09-02 NOTE — Assessment & Plan Note (Signed)
9/16 post-head injury (concussion) - much better now (12/16)

## 2015-09-02 NOTE — Progress Notes (Signed)
Pre visit review using our clinic review tool, if applicable. No additional management support is needed unless otherwise documented below in the visit note. 

## 2015-09-02 NOTE — Assessment & Plan Note (Signed)
BP is nl at home 

## 2015-09-02 NOTE — Progress Notes (Signed)
Subjective:  Patient ID: [name], female    DOB: August 10, 1951  Age: 64 y.o. MRN: [id number]  CC: No chief complaint on file.   HPI [name] presents for a well exam. F/u HAs and HTN f/u. Pt is becoming a GM of twin boys. Needs tDAP.  BP at home is 110/70 HA is much better  Outpatient Prescriptions Prior to Visit  Medication Sig Dispense Refill  . atovaquone-proguanil (MALARONE) 250-100 MG TABS tablet TAKE 1 -2 TABLET BY MOUTH BEFORE TRIP, DURING THE TRIP AND 7 DAYS AFTER TRIP. 35 tablet 0  . ciprofloxacin (CIPRO) 500 MG tablet TAKE 1 TABLET BY MOUTH TWICE A DAY 20 tablet 0  . fluconazole (DIFLUCAN) 150 MG tablet TAKE 1 TABLET (150 MG TOTAL) BY MOUTH ONCE. 3 tablet 0  . ibuprofen (ADVIL,MOTRIN) 600 MG tablet Take 1 tablet (600 mg total) by mouth 2 (two) times daily as needed. 120 tablet 0  . triamcinolone cream (KENALOG) 0.5 % USE TWO TIMES A DAY AS NEEDED 45 g 0   No facility-administered medications prior to visit.    ROS Review of Systems  Constitutional: Negative for chills, activity change, appetite change, fatigue and unexpected weight change.  HENT: Negative for congestion, mouth sores and sinus pressure.   Eyes: Negative for visual disturbance.  Respiratory: Negative for cough and chest tightness.   Gastrointestinal: Negative for nausea and abdominal pain.  Genitourinary: Negative for frequency, difficulty urinating and vaginal pain.  Musculoskeletal: Negative for back pain and gait problem.  Skin: Negative for pallor and rash.  Neurological: Negative for dizziness, tremors, weakness, numbness and headaches.  Psychiatric/Behavioral: Negative for confusion and sleep disturbance.    Objective:  BP 140/85 mmHg  Pulse 87  Wt 150 lb (68.04 kg)  SpO2 98%  BP Readings from Last 3 Encounters:  09/02/15 140/85  08/05/15 142/88  07/22/15 180/90    Wt Readings from Last 3 Encounters:  09/02/15 150 lb (68.04 kg)  08/05/15 153 lb (69.4 kg)    07/22/15 156 lb (70.761 kg)    Physical Exam  Constitutional: She appears well-developed. No distress.  HENT:  Head: Normocephalic.  Right Ear: External ear normal.  Left Ear: External ear normal.  Nose: Nose normal.  Mouth/Throat: Oropharynx is clear and moist.  Eyes: Conjunctivae are normal. Pupils are equal, round, and reactive to light. Right eye exhibits no discharge. Left eye exhibits no discharge.  Neck: Normal range of motion. Neck supple. No JVD present. No tracheal deviation present. No thyromegaly present.  Cardiovascular: Normal rate, regular rhythm and normal heart sounds.   Pulmonary/Chest: No stridor. No respiratory distress. She has no wheezes.  Abdominal: Soft. Bowel sounds are normal. She exhibits no distension and no mass. There is no tenderness. There is no rebound and no guarding.  Musculoskeletal: She exhibits no edema or tenderness.  Lymphadenopathy:    She has no cervical adenopathy.  Neurological: She displays normal reflexes. No cranial nerve deficit. She exhibits normal muscle tone. Coordination normal.  Skin: No rash noted. No erythema.  Psychiatric: She has a normal mood and affect. Her behavior is normal. Judgment and thought content normal.    Lab Results  Component Value Date   WBC 7.0 09/03/2015   HGB 13.6 09/03/2015   HCT 41.0 09/03/2015   PLT 344.0 09/03/2015   GLUCOSE 96 09/03/2015   CHOL 141 09/03/2015   TRIG 81.0 09/03/2015   HDL 51.80 09/03/2015   LDLDIRECT 139.0 04/07/2009   LDLCALC 73 09/03/2015  ALT 17 09/03/2015   AST 16 09/03/2015   NA 142 09/03/2015   K 4.3 09/03/2015   CL 103 09/03/2015   CREATININE 0.83 09/03/2015   BUN 14 09/03/2015   CO2 29 09/03/2015   TSH 2.32 09/03/2015    Ct Head Wo Contrast  06/12/2015  CLINICAL DATA:  Posterior scalp hematoma, occipital head trauma 05/21/2015. Persistent headache. EXAM: CT HEAD WITHOUT CONTRAST TECHNIQUE: Contiguous axial images were obtained from the base of the skull through  the vertex without intravenous contrast. COMPARISON:  None. FINDINGS: Occipital scalp swelling is evident. No subjacent skull fracture. Ethmoid mucoperiosteal thickening. Orbits are unremarkable. No acute hemorrhage, infarct, or mass lesion is identified. No midline shift. IMPRESSION: Posterior scalp swelling.  No acute intracranial abnormality. Electronically Signed   By: Conchita Paris M.D.   On: 06/12/2015 14:39    Assessment & Plan:   Diagnoses and all orders for this visit:  Well adult exam -     Basic metabolic panel; Future -     CBC with Differential/Platelet; Future -     Hepatic function panel; Future -     Hepatitis C antibody; Future -     Lipid panel; Future -     TSH; Future -     Urinalysis; Future  Other complicated headache syndrome  Elevated BP  Need for Tdap vaccination -     Tdap vaccine greater than or equal to 7yo IM  I am having Ms. Byrer maintain her triamcinolone cream, ibuprofen, ciprofloxacin, fluconazole, and atovaquone-proguanil.  No orders of the defined types were placed in this encounter.     Follow-up: Return in about 6 months (around 03/02/2016) for a follow-up visit.  Walker Kehr, MD

## 2015-09-03 ENCOUNTER — Other Ambulatory Visit (INDEPENDENT_AMBULATORY_CARE_PROVIDER_SITE_OTHER): Payer: 59

## 2015-09-03 ENCOUNTER — Ambulatory Visit
Admission: RE | Admit: 2015-09-03 | Discharge: 2015-09-03 | Disposition: A | Discharge status: Home / Self Care | Payer: 59 | Source: Ambulatory Visit

## 2015-09-03 ENCOUNTER — Other Ambulatory Visit: Payer: Self-pay

## 2015-09-03 DIAGNOSIS — Z1231 Encounter for screening mammogram for malignant neoplasm of breast: Secondary | ICD-10-CM

## 2015-09-03 DIAGNOSIS — Z0000 Encounter for general adult medical examination without abnormal findings: Secondary | ICD-10-CM | POA: Diagnosis not present

## 2015-09-03 LAB — LIPID PANEL
Cholesterol: 141 mg/dL (ref 0–200)
HDL: 51.8 mg/dL (ref >39.00)
LDL Cholesterol: 73 mg/dL (ref 0–99)
NONHDL: 89.34
Total CHOL/HDL Ratio: 3
Triglycerides: 81 mg/dL (ref 0.0–149.0)
VLDL: 16.2 mg/dL (ref 0.0–40.0)

## 2015-09-03 LAB — CBC WITH DIFFERENTIAL/PLATELET
BASOS ABS: 0 10*3/uL (ref 0.0–0.1)
BASOS PCT: 0.6 % (ref 0.0–3.0)
EOS PCT: 4.1 % (ref 0.0–5.0)
Eosinophils Absolute: 0.3 10*3/uL (ref 0.0–0.7)
HEMATOCRIT: 41 % (ref 36.0–46.0)
Hemoglobin: 13.6 g/dL (ref 12.0–15.0)
LYMPHS PCT: 35.5 % (ref 12.0–46.0)
Lymphs Abs: 2.5 10*3/uL (ref 0.7–4.0)
MCHC: 33.3 g/dL (ref 30.0–36.0)
MCV: 88.3 fl (ref 78.0–100.0)
MONOS PCT: 5.8 % (ref 3.0–12.0)
Monocytes Absolute: 0.4 10*3/uL (ref 0.1–1.0)
NEUTROS ABS: 3.8 10*3/uL (ref 1.4–7.7)
Neutrophils Relative %: 54 % (ref 43.0–77.0)
PLATELETS: 344 10*3/uL (ref 150.0–400.0)
RBC: 4.64 Mil/uL (ref 3.87–5.11)
RDW: 12.7 % (ref 11.5–15.5)
WBC: 7 10*3/uL (ref 4.0–10.5)

## 2015-09-03 LAB — TSH: TSH: 2.32 u[IU]/mL (ref 0.35–4.50)

## 2015-09-03 LAB — BASIC METABOLIC PANEL
BUN: 14 mg/dL (ref 6–23)
CHLORIDE: 103 meq/L (ref 96–112)
CO2: 29 meq/L (ref 19–32)
Calcium: 10 mg/dL (ref 8.4–10.5)
Creatinine, Ser: 0.83 mg/dL (ref 0.40–1.20)
GFR: 73.77 mL/min (ref >60.00)
Glucose, Bld: 96 mg/dL (ref 70–99)
POTASSIUM: 4.3 meq/L (ref 3.5–5.1)
SODIUM: 142 meq/L (ref 135–145)

## 2015-09-03 LAB — URINALYSIS
BILIRUBIN URINE: NEGATIVE
KETONES UR: NEGATIVE
LEUKOCYTES UA: NEGATIVE
Nitrite: NEGATIVE
SPECIFIC GRAVITY, URINE: 1.01 (ref 1.000–1.030)
Total Protein, Urine: NEGATIVE
UROBILINOGEN UA: 0.2 (ref 0.0–1.0)
Urine Glucose: NEGATIVE
pH: 6 (ref 5.0–8.0)

## 2015-09-03 LAB — HEPATIC FUNCTION PANEL
ALBUMIN: 4.5 g/dL (ref 3.5–5.2)
ALK PHOS: 71 U/L (ref 39–117)
ALT: 17 U/L (ref 0–35)
AST: 16 U/L (ref 0–37)
Bilirubin, Direct: 0.1 mg/dL (ref 0.0–0.3)
TOTAL PROTEIN: 7.7 g/dL (ref 6.0–8.3)
Total Bilirubin: 0.5 mg/dL (ref 0.2–1.2)

## 2015-09-04 ENCOUNTER — Encounter: Payer: Self-pay | Admitting: Internal Medicine

## 2015-09-04 LAB — HEPATITIS C ANTIBODY: HCV AB: NEGATIVE

## 2015-12-22 ENCOUNTER — Telehealth: Payer: Self-pay | Admitting: Internal Medicine

## 2015-12-22 ENCOUNTER — Encounter: Payer: Self-pay | Admitting: Internal Medicine

## 2015-12-22 ENCOUNTER — Ambulatory Visit (INDEPENDENT_AMBULATORY_CARE_PROVIDER_SITE_OTHER): Payer: 59 | Admitting: Internal Medicine

## 2015-12-22 VITALS — BP 158/100 | HR 83 | Wt 153.0 lb

## 2015-12-22 DIAGNOSIS — L218 Other seborrheic dermatitis: Secondary | ICD-10-CM

## 2015-12-22 DIAGNOSIS — L219 Seborrheic dermatitis, unspecified: Secondary | ICD-10-CM | POA: Insufficient documentation

## 2015-12-22 MED ORDER — KETOCONAZOLE 1 % EX SHAM: MEDICATED_SHAMPOO | CUTANEOUS | Status: DC

## 2015-12-22 NOTE — Progress Notes (Signed)
Subjective:  Patient ID: [name], female    DOB: 10/14/1950  Age: 65 y.o. MRN: [id number]  CC: No chief complaint on file.   HPI [name] presents for rash on scalp  Outpatient Prescriptions Prior to Visit  Medication Sig Dispense Refill  . fluconazole (DIFLUCAN) 150 MG tablet TAKE 1 TABLET (150 MG TOTAL) BY MOUTH ONCE. 3 tablet 0  . ibuprofen (ADVIL,MOTRIN) 600 MG tablet Take 1 tablet (600 mg total) by mouth 2 (two) times daily as needed. 120 tablet 0  . triamcinolone cream (KENALOG) 0.5 % USE TWO TIMES A DAY AS NEEDED 45 g 0  . atovaquone-proguanil (MALARONE) 250-100 MG TABS tablet TAKE 1 -2 TABLET BY MOUTH BEFORE TRIP, DURING THE TRIP AND 7 DAYS AFTER TRIP. (Patient not taking: Reported on 12/22/2015) 35 tablet 0  . ciprofloxacin (CIPRO) 500 MG tablet TAKE 1 TABLET BY MOUTH TWICE A DAY (Patient not taking: Reported on 12/22/2015) 20 tablet 0   No facility-administered medications prior to visit.    ROS Review of Systems  Constitutional: Negative for chills, activity change, appetite change, fatigue and unexpected weight change.  HENT: Negative for congestion, mouth sores and sinus pressure.   Eyes: Negative for visual disturbance.  Respiratory: Negative for cough and chest tightness.   Gastrointestinal: Negative for nausea and abdominal pain.  Genitourinary: Negative for frequency, difficulty urinating and vaginal pain.  Musculoskeletal: Negative for back pain and gait problem.  Skin: Positive for rash. Negative for pallor.  Neurological: Negative for dizziness, tremors, weakness, numbness and headaches.  Psychiatric/Behavioral: Negative for confusion and sleep disturbance. The patient is nervous/anxious.     Objective:  BP 158/100 mmHg  Pulse 83  Wt 153 lb (69.4 kg)  SpO2 95%  BP Readings from Last 3 Encounters:  12/22/15 158/100  09/02/15 140/85  08/05/15 142/88    Wt Readings from Last 3 Encounters:  12/22/15 153 lb (69.4 kg)  09/02/15  150 lb (68.04 kg)  08/05/15 153 lb (69.4 kg)    Physical Exam  Constitutional: She appears well-developed. No distress.  HENT:  Head: Normocephalic.  Right Ear: External ear normal.  Left Ear: External ear normal.  Nose: Nose normal.  Mouth/Throat: Oropharynx is clear and moist.  Eyes: Conjunctivae are normal. Pupils are equal, round, and reactive to light. Right eye exhibits no discharge. Left eye exhibits no discharge.  Neck: Normal range of motion. Neck supple. No JVD present. No tracheal deviation present. No thyromegaly present.  Cardiovascular: Normal rate, regular rhythm and normal heart sounds.   Pulmonary/Chest: No stridor. No respiratory distress. She has no wheezes.  Abdominal: Soft. Bowel sounds are normal. She exhibits no distension and no mass. There is no tenderness. There is no rebound and no guarding.  Musculoskeletal: She exhibits no edema or tenderness.  Lymphadenopathy:    She has no cervical adenopathy.  Neurological: She displays normal reflexes. No cranial nerve deficit. She exhibits normal muscle tone. Coordination normal.  Skin: Rash noted. No erythema.  Psychiatric: She has a normal mood and affect. Her behavior is normal. Judgment and thought content normal.  scaly rash on scalp w/yellow flakes  Lab Results  Component Value Date   WBC 7.0 09/03/2015   HGB 13.6 09/03/2015   HCT 41.0 09/03/2015   PLT 344.0 09/03/2015   GLUCOSE 96 09/03/2015   CHOL 141 09/03/2015   TRIG 81.0 09/03/2015   HDL 51.80 09/03/2015   LDLDIRECT 139.0 04/07/2009   LDLCALC 73 09/03/2015   ALT 17 09/03/2015  AST 16 09/03/2015   NA 142 09/03/2015   K 4.3 09/03/2015   CL 103 09/03/2015   CREATININE 0.83 09/03/2015   BUN 14 09/03/2015   CO2 29 09/03/2015   TSH 2.32 09/03/2015    Mm Digital Screening Bilateral  09/04/2015  CLINICAL DATA:  Screening. EXAM: DIGITAL SCREENING BILATERAL MAMMOGRAM WITH CAD COMPARISON:  Previous exam(s). ACR Breast Density Category b: There are  scattered areas of fibroglandular density. FINDINGS: There are no findings suspicious for malignancy. Images were processed with CAD. IMPRESSION: No mammographic evidence of malignancy. A result letter of this screening mammogram will be mailed directly to the patient. RECOMMENDATION: Screening mammogram in one year. (Code:SM-B-01Y) BI-RADS CATEGORY  1: Negative. Electronically Signed   By: Franki Cabot M.D.   On: 09/04/2015 15:52    Assessment & Plan:   Diagnoses and all orders for this visit:  Seborrheic dermatitis of scalp  Other orders -     KETOCONAZOLE, TOPICAL, 1 % SHAM; Use on hair 2/week  I have discontinued Ms. Harland's ciprofloxacin and atovaquone-proguanil. I am also having her start on KETOCONAZOLE (TOPICAL). Additionally, I am having her maintain her triamcinolone cream, ibuprofen, and fluconazole.  Meds ordered this encounter  Medications  . KETOCONAZOLE, TOPICAL, 1 % SHAM    Sig: Use on hair 2/week    Dispense:  200 mL    Refill:  2     Follow-up: No Follow-up on file.  Walker Kehr, MD

## 2015-12-22 NOTE — Patient Instructions (Addendum)
Head and shoulders shampoo    Seborrheic Dermatitis Seborrheic dermatitis involves pink or red skin with greasy, flaky scales. It usually occurs on the scalp, and it is often called dandruff. This condition may also affect the eyebrows, nose, ears, chest, and the bearded area of men's faces. It often occurs where skin has more oil (sebaceous) glands. It may come and go for no known reason, and it is often long-lasting (chronic). CAUSES The cause is not known. RISK FACTORS This condition is more like to develop in:  People who are stressed or tired.  People who have skin conditions, such as acne.  People who have certain conditions, such as:  HIV (human immunodeficiency virus).  AIDS (acquired immunodeficiency syndrome).  Parkinson disease.  An eating disorder.  Stroke.  Depression.  Epilepsy.  Alcoholism.  People who live in places that have extreme weather.  People who have a family history of seborrheic dermatitis.  People who use skin creams that are made with alcohol.  People who are 84-7 years old.  People who take certain medicines. SYMPTOMS Symptoms of this condition include:  Thick scales on the scalp.  Redness on the face or in the armpits.  Skin that is flaky. The flakes may be white or yellow.  Skin that seems oily or dry but is not helped with moisturizers.  Itching or burning in the affected areas. DIAGNOSIS This condition is diagnosed with a medical history and physical exam. A sample of your skin may be tested (skin biopsy). You may need to see a skin specialist (dermatologist). TREATMENT There is no cure for this condition, but treatment can help to manage the symptoms. Treatment may include:  Cortisone (steroid) ointments, creams, and lotions.  Over-the-counter or prescription shampoos. HOME CARE INSTRUCTIONS  Apply over-the-counter and prescription medicines only as told by your health care provider.  Keep all follow-up visits as  told by your health care provider. This is important.  Try to reduce your stress, such as with yoga or mediation. If you need help to reduce stress, ask your health care provider.  Shower or bathe as told by your health care provider.  Use any medicated shampoos as told by your health care provider. SEEK MEDICAL CARE IF:  Your symptoms do not improve with treatment.  Your symptoms get worse.  You have new symptoms.   This information is not intended to replace advice given to you by your health care provider. Make sure you discuss any questions you have with your health care provider.   Document Released: 09/13/2005 Document Revised: 06/04/2015 Document Reviewed: 01/29/2015 Elsevier Interactive Patient Education [employer].

## 2015-12-22 NOTE — Telephone Encounter (Signed)
Error

## 2015-12-22 NOTE — Progress Notes (Signed)
Pre visit review using our clinic review tool, if applicable. No additional management support is needed unless otherwise documented below in the visit note. 

## 2015-12-22 NOTE — Assessment & Plan Note (Addendum)
Head and shoulders shampoo ketocon shampoo

## 2016-04-27 ENCOUNTER — Other Ambulatory Visit: Payer: Self-pay | Admitting: Internal Medicine

## 2016-05-08 ENCOUNTER — Other Ambulatory Visit: Payer: Self-pay | Admitting: Internal Medicine

## 2016-06-29 ENCOUNTER — Other Ambulatory Visit: Payer: Self-pay | Admitting: Internal Medicine

## 2016-07-01 ENCOUNTER — Telehealth: Payer: Self-pay | Admitting: Internal Medicine

## 2016-07-01 ENCOUNTER — Other Ambulatory Visit: Payer: Self-pay | Admitting: Obstetrics and Gynecology

## 2016-07-01 DIAGNOSIS — Z1231 Encounter for screening mammogram for malignant neoplasm of breast: Secondary | ICD-10-CM

## 2016-07-01 NOTE — Telephone Encounter (Signed)
Patient is going to the [place] on the [id number].  She is requesting a call back to make sure she is up to date on all vaccinations.

## 2016-07-02 LAB — HM MAMMOGRAPHY

## 2016-07-02 NOTE — Telephone Encounter (Signed)
Does she need any new vaccines?

## 2016-07-02 NOTE — Telephone Encounter (Signed)
Also, requesting Flagyl rx to take on her trip. Please advise on Rx and any needed vaccines.

## 2016-07-03 NOTE — Telephone Encounter (Signed)
No need for Flagyl for a trip Thx

## 2016-07-05 ENCOUNTER — Other Ambulatory Visit: Payer: Self-pay | Admitting: Obstetrics and Gynecology

## 2016-07-05 ENCOUNTER — Telehealth: Payer: Self-pay | Admitting: Internal Medicine

## 2016-07-05 DIAGNOSIS — E2839 Other primary ovarian failure: Secondary | ICD-10-CM

## 2016-07-05 DIAGNOSIS — Z0000 Encounter for general adult medical examination without abnormal findings: Secondary | ICD-10-CM

## 2016-07-05 NOTE — Telephone Encounter (Signed)
Notified pt labs entered.../lmb 

## 2016-07-05 NOTE — Telephone Encounter (Signed)
Pt informed Per PCP- she can have Twinrix and Flu vaccine if she desires to do so. She states she has already had flu vaccine. Transferred to scheduler to schedule nurse visit.

## 2016-07-05 NOTE — Telephone Encounter (Signed)
CBC, TSH, CMET, UA, lipids Thx

## 2016-07-05 NOTE — Telephone Encounter (Signed)
Patient is requesting labs to be done before her CPE on 10/31. Please advise or follow up, Thank you.

## 2016-07-06 ENCOUNTER — Encounter: Payer: Self-pay | Admitting: Internal Medicine

## 2016-07-06 ENCOUNTER — Ambulatory Visit: Payer: 59

## 2016-07-07 ENCOUNTER — Ambulatory Visit
Admission: RE | Admit: 2016-07-07 | Discharge: 2016-07-07 | Disposition: A | Discharge status: Home / Self Care | Payer: 59 | Source: Ambulatory Visit | Attending: Obstetrics and Gynecology | Admitting: Obstetrics and Gynecology

## 2016-07-07 ENCOUNTER — Other Ambulatory Visit (INDEPENDENT_AMBULATORY_CARE_PROVIDER_SITE_OTHER): Payer: 59

## 2016-07-07 DIAGNOSIS — Z0000 Encounter for general adult medical examination without abnormal findings: Secondary | ICD-10-CM | POA: Diagnosis not present

## 2016-07-07 DIAGNOSIS — E2839 Other primary ovarian failure: Secondary | ICD-10-CM

## 2016-07-07 LAB — URINALYSIS, ROUTINE W REFLEX MICROSCOPIC
BILIRUBIN URINE: NEGATIVE
KETONES UR: NEGATIVE
LEUKOCYTES UA: NEGATIVE
NITRITE: NEGATIVE
PH: 6 (ref 5.0–8.0)
RBC / HPF: NONE SEEN (ref 0–?)
SPECIFIC GRAVITY, URINE: 1.01 (ref 1.000–1.030)
TOTAL PROTEIN, URINE-UPE24: NEGATIVE
URINE GLUCOSE: NEGATIVE
UROBILINOGEN UA: 0.2 (ref 0.0–1.0)

## 2016-07-07 LAB — COMPREHENSIVE METABOLIC PANEL
ALK PHOS: 65 U/L (ref 39–117)
ALT: 14 U/L (ref 0–35)
AST: 15 U/L (ref 0–37)
Albumin: 4.2 g/dL (ref 3.5–5.2)
BILIRUBIN TOTAL: 0.4 mg/dL (ref 0.2–1.2)
BUN: 15 mg/dL (ref 6–23)
CALCIUM: 9.6 mg/dL (ref 8.4–10.5)
CO2: 27 mEq/L (ref 19–32)
Chloride: 102 mEq/L (ref 96–112)
Creatinine, Ser: 0.74 mg/dL (ref 0.40–1.20)
GFR: 83.99 mL/min (ref >60.00)
GLUCOSE: 102 mg/dL — AB (ref 70–99)
POTASSIUM: 3.8 meq/L (ref 3.5–5.1)
Sodium: 138 mEq/L (ref 135–145)
TOTAL PROTEIN: 7.5 g/dL (ref 6.0–8.3)

## 2016-07-07 LAB — CBC WITH DIFFERENTIAL/PLATELET
BASOS ABS: 0 10*3/uL (ref 0.0–0.1)
Basophils Relative: 0.3 % (ref 0.0–3.0)
Eosinophils Absolute: 0.2 10*3/uL (ref 0.0–0.7)
Eosinophils Relative: 3.3 % (ref 0.0–5.0)
HCT: 41.5 % (ref 36.0–46.0)
Hemoglobin: 13.7 g/dL (ref 12.0–15.0)
LYMPHS ABS: 2.5 10*3/uL (ref 0.7–4.0)
Lymphocytes Relative: 37 % (ref 12.0–46.0)
MCHC: 33 g/dL (ref 30.0–36.0)
MCV: 88.7 fl (ref 78.0–100.0)
MONO ABS: 0.4 10*3/uL (ref 0.1–1.0)
Monocytes Relative: 6.3 % (ref 3.0–12.0)
NEUTROS PCT: 53.1 % (ref 43.0–77.0)
Neutro Abs: 3.6 10*3/uL (ref 1.4–7.7)
PLATELETS: 301 10*3/uL (ref 150.0–400.0)
RBC: 4.68 Mil/uL (ref 3.87–5.11)
RDW: 13.2 % (ref 11.5–15.5)
WBC: 6.8 10*3/uL (ref 4.0–10.5)

## 2016-07-07 LAB — LIPID PANEL
CHOL/HDL RATIO: 3
Cholesterol: 168 mg/dL (ref 0–200)
HDL: 58.1 mg/dL (ref >39.00)
LDL Cholesterol: 92 mg/dL (ref 0–99)
NONHDL: 109.67
Triglycerides: 87 mg/dL (ref 0.0–149.0)
VLDL: 17.4 mg/dL (ref 0.0–40.0)

## 2016-07-26 ENCOUNTER — Encounter: Payer: 59 | Admitting: Internal Medicine

## 2016-07-27 ENCOUNTER — Ambulatory Visit (INDEPENDENT_AMBULATORY_CARE_PROVIDER_SITE_OTHER): Payer: 59 | Admitting: Internal Medicine

## 2016-07-27 ENCOUNTER — Encounter: Payer: Self-pay | Admitting: Internal Medicine

## 2016-07-27 VITALS — BP 130/70 | HR 71 | Ht 64.0 in | Wt 151.0 lb

## 2016-07-27 DIAGNOSIS — Z0000 Encounter for general adult medical examination without abnormal findings: Secondary | ICD-10-CM | POA: Diagnosis not present

## 2016-07-27 DIAGNOSIS — Z1211 Encounter for screening for malignant neoplasm of colon: Secondary | ICD-10-CM | POA: Diagnosis not present

## 2016-07-27 DIAGNOSIS — R030 Elevated blood-pressure reading, without diagnosis of hypertension: Secondary | ICD-10-CM

## 2016-07-27 DIAGNOSIS — Z23 Encounter for immunization: Secondary | ICD-10-CM | POA: Diagnosis not present

## 2016-07-27 NOTE — Assessment & Plan Note (Signed)
resolved 

## 2016-07-27 NOTE — Assessment & Plan Note (Addendum)
We discussed age appropriate health related issues, including available/recomended screening tests and vaccinations. We discussed a need for adhering to healthy diet and exercise. Labs were reviewed. All questions were answered. Colon - Dr Silverio Decamp

## 2016-07-27 NOTE — Progress Notes (Signed)
Pre visit review using our clinic review tool, if applicable. No additional management support is needed unless otherwise documented below in the visit note. 

## 2016-07-27 NOTE — Progress Notes (Signed)
Subjective:  Patient ID: [name] Meals, female    DOB: Jul 02, 1951  Age: 65 y.o. MRN: [id number]  CC: Annual Exam   HPI [name] presents for a well exam  Outpatient Medications Prior to Visit  Medication Sig Dispense Refill  . atovaquone-proguanil (MALARONE) 250-100 MG TABS tablet TAKE 1 -2 TABLET BY MOUTH BEFORE TRIP, DURING THE TRIP AND 7 DAYS AFTER TRIP. 35 tablet 0  . ciprofloxacin (CIPRO) 500 MG tablet TAKE 1 TABLET BY MOUTH TWICE A DAY 20 tablet 0  . fluconazole (DIFLUCAN) 150 MG tablet TAKE 1 TABLET (150 MG TOTAL) BY MOUTH ONCE. 3 tablet 0  . ibuprofen (ADVIL,MOTRIN) 600 MG tablet TAKE 1 TABLET (600 MG TOTAL) BY MOUTH 2 (TWO) TIMES DAILY AS NEEDED. 120 tablet 0  . KETOCONAZOLE, TOPICAL, 1 % SHAM Use on hair 2/week 200 mL 2  . triamcinolone cream (KENALOG) 0.5 % USE TWO TIMES A DAY AS NEEDED 45 g 0   No facility-administered medications prior to visit.     ROS Review of Systems  Constitutional: Negative for activity change, appetite change, chills, fatigue and unexpected weight change.  HENT: Negative for congestion, mouth sores and sinus pressure.   Eyes: Negative for visual disturbance.  Respiratory: Negative for cough and chest tightness.   Gastrointestinal: Negative for abdominal pain and nausea.  Genitourinary: Negative for difficulty urinating, frequency and vaginal pain.  Musculoskeletal: Negative for back pain and gait problem.  Skin: Negative for pallor and rash.  Neurological: Negative for dizziness, tremors, weakness, numbness and headaches.  Psychiatric/Behavioral: Negative for confusion and sleep disturbance.    Objective:  BP 130/70   Pulse 71   Ht 5\' 4"  (1.626 m)   Wt 151 lb (68.5 kg)   SpO2 97%   BMI 25.92 kg/m   BP Readings from Last 3 Encounters:  07/27/16 130/70  12/22/15 (!) 158/100  09/02/15 140/85    Wt Readings from Last 3 Encounters:  07/27/16 151 lb (68.5 kg)  12/22/15 153 lb (69.4 kg)  09/02/15 150 lb (68 kg)     Physical Exam  Constitutional: She appears well-developed. No distress.  HENT:  Head: Normocephalic.  Right Ear: External ear normal.  Left Ear: External ear normal.  Nose: Nose normal.  Mouth/Throat: Oropharynx is clear and moist.  Eyes: Conjunctivae are normal. Pupils are equal, round, and reactive to light. Right eye exhibits no discharge. Left eye exhibits no discharge.  Neck: Normal range of motion. Neck supple. No JVD present. No tracheal deviation present. No thyromegaly present.  Cardiovascular: Normal rate, regular rhythm and normal heart sounds.   Pulmonary/Chest: No stridor. No respiratory distress. She has no wheezes.  Abdominal: Soft. Bowel sounds are normal. She exhibits no distension and no mass. There is no tenderness. There is no rebound and no guarding.  Musculoskeletal: She exhibits no edema or tenderness.  Lymphadenopathy:    She has no cervical adenopathy.  Neurological: She displays normal reflexes. No cranial nerve deficit. She exhibits normal muscle tone. Coordination normal.  Skin: No rash noted. No erythema.  Psychiatric: She has a normal mood and affect. Her behavior is normal. Judgment and thought content normal.    Lab Results  Component Value Date   WBC 6.8 07/07/2016   HGB 13.7 07/07/2016   HCT 41.5 07/07/2016   PLT 301.0 07/07/2016   GLUCOSE 102 (H) 07/07/2016   CHOL 168 07/07/2016   TRIG 87.0 07/07/2016   HDL 58.10 07/07/2016   LDLDIRECT 139.0 04/07/2009   LDLCALC 92  07/07/2016   ALT 14 07/07/2016   AST 15 07/07/2016   NA 138 07/07/2016   K 3.8 07/07/2016   CL 102 07/07/2016   CREATININE 0.74 07/07/2016   BUN 15 07/07/2016   CO2 27 07/07/2016   TSH 2.32 09/03/2015    Dg Bone Density  Result Date: 07/07/2016 EXAM: DUAL X-RAY ABSORPTIOMETRY (DXA) FOR BONE MINERAL DENSITY IMPRESSION: Referring Physician:  Brien Few PATIENT: Name: [name] Patient ID: [id number] Birth Date: 1951/02/22 Height: 64.0 in. Sex: Female Measured:  07/07/2016 Weight: 150.0 lbs. Indications: Caucasian, Estrogen Deficient, Postmenopausal Fractures: None Treatments: Calcium (E943.0), Vitamin D (E933.5) ASSESSMENT: The BMD measured at AP Spine L1-L4 is 0.985 g/cm2 with a T-score of -1.7. This patient is considered osteopenic according to [facility]) criteria. There has been a statistically significant decrease in BMD of left hip and no statistically significant change in lumbar spine since prior exam dated 06/15/2006. Site Region Measured Date Measured Age YA BMD Significant CHANGE T-score AP Spine  L1-L4      07[age]07/2016    63.9         -1.7    0.985 g/cm2 DualFemur Neck Right 07/07/2016    63.9         -1.6    0.814 g/cm2 World Health Organization [facility]) criteria for post-menopausal, Caucasian Women: Normal       T-score at or above -1 SD Osteopenia   T-score between -1 and -2.5 SD Osteoporosis T-score at or below -2.5 SD RECOMMENDATION: [facility] recommends that FDA-approved medical therapies be considered in postmenopausal women and men age 81 or older with a: 1. Hip or vertebral (clinical or morphometric) fracture. 2. T-score of <-2.5 at the spine or hip. 3. Ten-year fracture probability by FRAX of 3% or greater for hip fracture or 20% or greater for major osteoporotic fracture. All treatment decisions require clinical judgment and consideration of individual patient factors, including patient preferences, co-morbidities, previous drug use, risk factors not captured in the FRAX model (e.g. falls, vitamin D deficiency, increased bone turnover, interval significant decline in bone density) and possible under - or over-estimation of fracture risk by FRAX. All patients should ensure an adequate intake of dietary calcium (1200 mg/d) and vitamin D (800 IU daily) unless contraindicated. FOLLOW-UP: People with diagnosed cases of osteoporosis or at high risk for fracture should have regular bone mineral density tests. For  patients eligible for Medicare, routine testing is allowed once every 2 years. The testing frequency can be increased to one year for patients who have rapidly progressing disease, those who are receiving or discontinuing medical therapy to restore bone mass, or have additional risk factors. I have reviewed this report, and agree with the above findings. [facility] FRAX* 10-year Probability of Fracture Based on femoral neck BMD: DualFemur (Right) Major Osteoporotic Fracture: 9.2% Hip Fracture:                1.0% Population:                  [place] (Caucasian) Risk Factors:                None *FRAX is a Materials engineer of the [employer] for Metabolic Bone Disease, a World Pharmacologist (WHO) [facility]. ASSESSMENT: The probability of a major osteoporotic fracture is 9.2 % within the next ten years. The probability of a hip fracture is 1.0 % within the next ten years. Electronically Signed   By: Lennette Bihari  [place] M.D.   On: 07/07/2016 15:30    Assessment & Plan:   There are no diagnoses linked to this encounter. I am having Ms. [name] maintain her triamcinolone cream, KETOCONAZOLE (TOPICAL), ibuprofen, atovaquone-proguanil, ciprofloxacin, and fluconazole.  No orders of the defined types were placed in this encounter.    Follow-up: No Follow-up on file.  Walker Kehr, MD

## 2016-07-27 NOTE — Addendum Note (Signed)
Addended by: Cresenciano Lick on: 07/27/2016 04:57 PM   Modules accepted: Orders

## 2016-08-04 ENCOUNTER — Encounter: Payer: Self-pay | Admitting: Gastroenterology

## 2016-08-26 ENCOUNTER — Ambulatory Visit: Payer: 59

## 2016-08-26 ENCOUNTER — Telehealth: Payer: Self-pay | Admitting: Gastroenterology

## 2016-08-30 NOTE — Telephone Encounter (Signed)
Very lengthy conversation about the different types prep.

## 2016-08-31 ENCOUNTER — Ambulatory Visit (INDEPENDENT_AMBULATORY_CARE_PROVIDER_SITE_OTHER): Payer: 59 | Admitting: General Practice

## 2016-08-31 DIAGNOSIS — Z23 Encounter for immunization: Secondary | ICD-10-CM | POA: Diagnosis not present

## 2016-10-04 ENCOUNTER — Encounter: Payer: Self-pay | Admitting: Internal Medicine

## 2016-12-21 ENCOUNTER — Telehealth: Payer: Self-pay | Admitting: Internal Medicine

## 2016-12-21 NOTE — Telephone Encounter (Signed)
Routing to dr plotnikov, please advise, thanks 

## 2016-12-21 NOTE — Telephone Encounter (Signed)
OK Lipids, BMET, LFT, TSH, UA, CBC Thx

## 2016-12-21 NOTE — Telephone Encounter (Signed)
Pt has a CPE scheduled for 01/10/17 and she would like to know if she can have the blood work done before she comes in to see Dr Camila Li. Please advise. Thanks [employer]

## 2016-12-22 ENCOUNTER — Telehealth: Payer: Self-pay

## 2016-12-22 DIAGNOSIS — Z0000 Encounter for general adult medical examination without abnormal findings: Secondary | ICD-10-CM

## 2016-12-22 NOTE — Telephone Encounter (Signed)
error 

## 2016-12-22 NOTE — Telephone Encounter (Signed)
Patient advised labs are in, can come at earliest convenience

## 2016-12-27 ENCOUNTER — Other Ambulatory Visit (INDEPENDENT_AMBULATORY_CARE_PROVIDER_SITE_OTHER): Payer: 59

## 2016-12-27 DIAGNOSIS — Z0000 Encounter for general adult medical examination without abnormal findings: Secondary | ICD-10-CM | POA: Diagnosis not present

## 2016-12-27 LAB — URINALYSIS, ROUTINE W REFLEX MICROSCOPIC
Bilirubin Urine: NEGATIVE
Ketones, ur: NEGATIVE
Leukocytes, UA: NEGATIVE
NITRITE: NEGATIVE
PH: 6 (ref 5.0–8.0)
SPECIFIC GRAVITY, URINE: 1.015 (ref 1.000–1.030)
TOTAL PROTEIN, URINE-UPE24: NEGATIVE
URINE GLUCOSE: NEGATIVE
Urobilinogen, UA: 0.2 (ref 0.0–1.0)

## 2016-12-27 LAB — BASIC METABOLIC PANEL
BUN: 19 mg/dL (ref 6–23)
CHLORIDE: 102 meq/L (ref 96–112)
CO2: 30 mEq/L (ref 19–32)
CREATININE: 0.77 mg/dL (ref 0.40–1.20)
Calcium: 9.4 mg/dL (ref 8.4–10.5)
GFR: 80.1 mL/min (ref >60.00)
GLUCOSE: 98 mg/dL (ref 70–99)
Potassium: 3.9 mEq/L (ref 3.5–5.1)
Sodium: 140 mEq/L (ref 135–145)

## 2016-12-27 LAB — LIPID PANEL
CHOL/HDL RATIO: 3
Cholesterol: 153 mg/dL (ref 0–200)
HDL: 59.3 mg/dL (ref >39.00)
LDL CALC: 83 mg/dL (ref 0–99)
NonHDL: 93.89
TRIGLYCERIDES: 53 mg/dL (ref 0.0–149.0)
VLDL: 10.6 mg/dL (ref 0.0–40.0)

## 2016-12-27 LAB — HEPATIC FUNCTION PANEL
ALBUMIN: 4.4 g/dL (ref 3.5–5.2)
ALT: 16 U/L (ref 0–35)
AST: 19 U/L (ref 0–37)
Alkaline Phosphatase: 64 U/L (ref 39–117)
BILIRUBIN TOTAL: 0.4 mg/dL (ref 0.2–1.2)
Bilirubin, Direct: 0.1 mg/dL (ref 0.0–0.3)
Total Protein: 7.4 g/dL (ref 6.0–8.3)

## 2016-12-27 LAB — CBC
HEMATOCRIT: 38.7 % (ref 36.0–46.0)
HEMOGLOBIN: 12.9 g/dL (ref 12.0–15.0)
MCHC: 33.4 g/dL (ref 30.0–36.0)
MCV: 88.4 fl (ref 78.0–100.0)
PLATELETS: 279 10*3/uL (ref 150.0–400.0)
RBC: 4.38 Mil/uL (ref 3.87–5.11)
RDW: 13 % (ref 11.5–15.5)
WBC: 6.3 10*3/uL (ref 4.0–10.5)

## 2016-12-27 LAB — TSH: TSH: 2.34 u[IU]/mL (ref 0.35–4.50)

## 2017-01-10 ENCOUNTER — Ambulatory Visit: Payer: 59 | Admitting: Internal Medicine

## 2017-01-10 ENCOUNTER — Ambulatory Visit (INDEPENDENT_AMBULATORY_CARE_PROVIDER_SITE_OTHER): Payer: 59 | Admitting: Internal Medicine

## 2017-01-10 ENCOUNTER — Encounter: Payer: Self-pay | Admitting: Internal Medicine

## 2017-01-10 VITALS — BP 138/82 | HR 72 | Temp 97.4°F | Ht 64.0 in | Wt 146.1 lb

## 2017-01-10 DIAGNOSIS — Z1211 Encounter for screening for malignant neoplasm of colon: Secondary | ICD-10-CM

## 2017-01-10 DIAGNOSIS — Z7189 Other specified counseling: Secondary | ICD-10-CM

## 2017-01-10 DIAGNOSIS — Z23 Encounter for immunization: Secondary | ICD-10-CM | POA: Diagnosis not present

## 2017-01-10 DIAGNOSIS — Z0000 Encounter for general adult medical examination without abnormal findings: Secondary | ICD-10-CM

## 2017-01-10 DIAGNOSIS — Z7184 Encounter for health counseling related to travel: Secondary | ICD-10-CM

## 2017-01-10 MED ORDER — ATOVAQUONE-PROGUANIL HCL 250-100 MG PO TABS: ORAL_TABLET | ORAL | 0 refills | Status: DC

## 2017-01-10 MED ORDER — FLUCONAZOLE 150 MG PO TABS: ORAL_TABLET | ORAL | 0 refills | Status: DC

## 2017-01-10 MED ORDER — IBUPROFEN 600 MG PO TABS: 600.0000 mg | ORAL_TABLET | Freq: Two times a day (BID) | ORAL | 0 refills | Status: DC | PRN

## 2017-01-10 MED ORDER — CIPROFLOXACIN HCL 500 MG PO TABS: 500.0000 mg | ORAL_TABLET | Freq: Two times a day (BID) | ORAL | 0 refills | Status: DC

## 2017-01-10 MED ORDER — VITAMIN D3 50 MCG (2000 UT) PO CAPS: 2000.0000 [IU] | ORAL_CAPSULE | Freq: Every day | ORAL | 3 refills | Status: AC

## 2017-01-10 NOTE — Progress Notes (Signed)
Subjective:  Patient ID: [name], female    DOB: [birth year]  Age: 66 y.o. MRN: [id number]  CC: No chief complaint on file.   HPI [name] presents for a well exam  Outpatient Medications Prior to Visit  Medication Sig Dispense Refill  . atovaquone-proguanil (MALARONE) 250-100 MG TABS tablet TAKE 1 -2 TABLET BY MOUTH BEFORE TRIP, DURING THE TRIP AND 7 DAYS AFTER TRIP. 35 tablet 0  . ciprofloxacin (CIPRO) 500 MG tablet TAKE 1 TABLET BY MOUTH TWICE A DAY 20 tablet 0  . fluconazole (DIFLUCAN) 150 MG tablet TAKE 1 TABLET (150 MG TOTAL) BY MOUTH ONCE. 3 tablet 0  . ibuprofen (ADVIL,MOTRIN) 600 MG tablet TAKE 1 TABLET (600 MG TOTAL) BY MOUTH 2 (TWO) TIMES DAILY AS NEEDED. 120 tablet 0  . triamcinolone cream (KENALOG) 0.5 % USE TWO TIMES A DAY AS NEEDED 45 g 0  . KETOCONAZOLE, TOPICAL, 1 % SHAM Use on hair 2/week (Patient not taking: Reported on 01/10/2017) 200 mL 2   No facility-administered medications prior to visit.     ROS Review of Systems  Constitutional: Negative for activity change, appetite change, chills, fatigue and unexpected weight change.  HENT: Negative for congestion, mouth sores and sinus pressure.   Eyes: Negative for visual disturbance.  Respiratory: Negative for cough and chest tightness.   Gastrointestinal: Negative for abdominal pain and nausea.  Genitourinary: Negative for difficulty urinating, frequency and vaginal pain.  Musculoskeletal: Negative for back pain and gait problem.  Skin: Negative for pallor and rash.  Neurological: Negative for dizziness, tremors, weakness, numbness and headaches.  Psychiatric/Behavioral: Negative for confusion, sleep disturbance and suicidal ideas.    Objective:  BP (!) 152/88 (BP Location: Right Arm, Patient Position: Sitting, Cuff Size: Normal)   Pulse 72   Temp 97.4 F (36.3 C) (Oral)   Ht 5\' 4"  (1.626 m)   Wt 146 lb 1.3 oz (66.3 kg)   SpO2 100%   BMI 25.07 kg/m   BP Readings from Last 3  Encounters:  01/10/17 (!) 152/88  07/27/16 130/70  12/22/15 (!) 158/100    Wt Readings from Last 3 Encounters:  01/10/17 146 lb 1.3 oz (66.3 kg)  07/27/16 151 lb (68.5 kg)  12/22/15 153 lb (69.4 kg)    Physical Exam  Constitutional: She appears well-developed. No distress.  HENT:  Head: Normocephalic.  Right Ear: External ear normal.  Left Ear: External ear normal.  Nose: Nose normal.  Mouth/Throat: Oropharynx is clear and moist.  Eyes: Conjunctivae are normal. Pupils are equal, round, and reactive to light. Right eye exhibits no discharge. Left eye exhibits no discharge.  Neck: Normal range of motion. Neck supple. No JVD present. No tracheal deviation present. No thyromegaly present.  Cardiovascular: Normal rate, regular rhythm and normal heart sounds.   Pulmonary/Chest: No stridor. No respiratory distress. She has no wheezes.  Abdominal: Soft. Bowel sounds are normal. She exhibits no distension and no mass. There is no tenderness. There is no rebound and no guarding.  Musculoskeletal: She exhibits no edema or tenderness.  Lymphadenopathy:    She has no cervical adenopathy.  Neurological: She displays normal reflexes. No cranial nerve deficit. She exhibits normal muscle tone. Coordination normal.  Skin: No rash noted. No erythema.  Psychiatric: She has a normal mood and affect. Her behavior is normal. Judgment and thought content normal.    Lab Results  Component Value Date   WBC 6.3 12/27/2016   HGB 12.9 12/27/2016   HCT 38.7 12/27/2016  PLT 279.0 12/27/2016   GLUCOSE 98 12/27/2016   CHOL 153 12/27/2016   TRIG 53.0 12/27/2016   HDL 59.30 12/27/2016   LDLDIRECT 139.0 04/07/2009   LDLCALC 83 12/27/2016   ALT 16 12/27/2016   AST 19 12/27/2016   NA 140 12/27/2016   K 3.9 12/27/2016   CL 102 12/27/2016   CREATININE 0.77 12/27/2016   BUN 19 12/27/2016   CO2 30 12/27/2016   TSH 2.34 12/27/2016    Dg Bone Density  Result Date: 07/07/2016 EXAM: DUAL X-RAY  ABSORPTIOMETRY (DXA) FOR BONE MINERAL DENSITY IMPRESSION: Referring Physician:  Brien Few PATIENT: Name: [name] Patient ID: [id number] Birth Date: [birth year] Height: 64.0 in. Sex: Female Measured: 07/07/2016 Weight: 150.0 lbs. Indications: Caucasian, Estrogen Deficient, Postmenopausal Fractures: None Treatments: Calcium (E943.0), Vitamin D (E933.5) ASSESSMENT: The BMD measured at AP Spine L1-L4 is 0.985 g/cm2 with a T-score of -1.7. This patient is considered osteopenic according to [facility]) criteria. There has been a statistically significant decrease in BMD of left hip and no statistically significant change in lumbar spine since prior exam dated 06/15/2006. Site Region Measured Date Measured Age YA BMD Significant CHANGE T-score AP Spine  L1-L4      8/07[age]    63.9         -1.7    0.985 g/cm2 DualFemur Neck Right 07/07/2016    63.9         -1.7    0.814 g/cm2 World Health Organization [facility]) criteria for post-menopausal, Caucasian Women: Normal       T-score at or above -1 SD Osteopenia   T-score between -1 and -2.5 SD Osteoporosis T-score at or below -2.5 SD RECOMMENDATION: [place] [facility] recommends that FDA-approved medical therapies be considered in postmenopausal women and men age 57 or older with a: 1. Hip or vertebral (clinical or morphometric) fracture. 2. T-score of <-2.5 at the spine or hip. 3. Ten-year fracture probability by FRAX of 3% or greater for hip fracture or 20% or greater for major osteoporotic fracture. All treatment decisions require clinical judgment and consideration of individual patient factors, including patient preferences, co-morbidities, previous drug use, risk factors not captured in the FRAX model (e.g. falls, vitamin D deficiency, increased bone turnover, interval significant decline in bone density) and possible under - or over-estimation of fracture risk by FRAX. All patients should ensure an adequate intake of dietary  calcium (1200 mg/d) and vitamin D (800 IU daily) unless contraindicated. FOLLOW-UP: People with diagnosed cases of osteoporosis or at high risk for fracture should have regular bone mineral density tests. For patients eligible for Medicare, routine testing is allowed once every 2 years. The testing frequency can be increased to one year for patients who have rapidly progressing disease, those who are receiving or discontinuing medical therapy to restore bone mass, or have additional risk factors. I have reviewed this report, and agree with the above findings. [facility] FRAX* 10-year Probability of Fracture Based on femoral neck BMD: DualFemur (Right) Major Osteoporotic Fracture: 9.2% Hip Fracture:                1.0% Population:                  [place] (Caucasian) Risk Factors:                None *FRAX is a Materials engineer of the [employer] for Metabolic Bone Disease, a World Pharmacologist (WHO) [facility]. ASSESSMENT: The probability of  a major osteoporotic fracture is 9.2 % within the next ten years. The probability of a hip fracture is 1.0 % within the next ten years. Electronically Signed   By: Rolm Baptise M.D.   On: 07/07/2016 15:30    Assessment & Plan:   There are no diagnoses linked to this encounter. I am having Ms. Zellner maintain her triamcinolone cream, KETOCONAZOLE (TOPICAL), ibuprofen, atovaquone-proguanil, ciprofloxacin, and fluconazole.  No orders of the defined types were placed in this encounter.    Follow-up: No Follow-up on file.  Walker Kehr, MD

## 2017-01-10 NOTE — Progress Notes (Signed)
Pre visit review using our clinic review tool, if applicable. No additional management support is needed unless otherwise documented below in the visit note. 

## 2017-01-10 NOTE — Assessment & Plan Note (Addendum)
We discussed age appropriate health related issues, including available/recomended screening tests and vaccinations. We discussed a need for adhering to healthy diet and exercise. Labs were reviewed. All questions were answered. Twinrix Shingrix

## 2017-01-10 NOTE — Assessment & Plan Note (Signed)
Cipro, Malarone, Diflucan

## 2017-02-16 ENCOUNTER — Encounter: Payer: Self-pay | Admitting: Gastroenterology

## 2017-03-04 ENCOUNTER — Ambulatory Visit (AMBULATORY_SURGERY_CENTER): Payer: Self-pay | Admitting: *Deleted

## 2017-03-04 ENCOUNTER — Encounter: Payer: Self-pay | Admitting: *Deleted

## 2017-03-04 VITALS — Ht 64.0 in | Wt 143.8 lb

## 2017-03-04 DIAGNOSIS — Z1211 Encounter for screening for malignant neoplasm of colon: Secondary | ICD-10-CM

## 2017-03-04 MED ORDER — NA SULFATE-K SULFATE-MG SULF 17.5-3.13-1.6 GM/177ML PO SOLN: 1.0000 | Freq: Once | ORAL | 0 refills | Status: AC

## 2017-03-04 NOTE — Progress Notes (Addendum)
No egg or soy allergy known to patient  No issues with past sedation with any surgeries  or procedures but she states hard to wake post op  , no intubation problems  No diet pills per patient No home 02 use per patient  No blood thinners per patient  Pt denies issues with constipation  No A fib or A flutter  EMMI video sent to pt's e mail  Pt was in [facility] for 2 hours today - we changed appt date and time after initial  and discussed instructions multiple times- she left and returned with more questions and considering difficulty conveying instructions this many times uncertain if will prep adequately  15$ coupon to pt for prep

## 2017-03-09 ENCOUNTER — Encounter: Payer: Self-pay | Admitting: Gastroenterology

## 2017-03-09 ENCOUNTER — Ambulatory Visit (AMBULATORY_SURGERY_CENTER): Payer: 59 | Admitting: Gastroenterology

## 2017-03-09 VITALS — BP 128/78 | HR 58 | Temp 98.6°F | Resp 18 | Ht 64.0 in | Wt 143.0 lb

## 2017-03-09 DIAGNOSIS — D123 Benign neoplasm of transverse colon: Secondary | ICD-10-CM | POA: Diagnosis not present

## 2017-03-09 DIAGNOSIS — Z1212 Encounter for screening for malignant neoplasm of rectum: Secondary | ICD-10-CM | POA: Diagnosis not present

## 2017-03-09 DIAGNOSIS — Z1211 Encounter for screening for malignant neoplasm of colon: Secondary | ICD-10-CM | POA: Diagnosis not present

## 2017-03-09 MED ORDER — SODIUM CHLORIDE 0.9 % IV SOLN: 500.0000 mL | INTRAVENOUS | Status: DC

## 2017-03-09 NOTE — Progress Notes (Signed)
Pt's states no medical or surgical changes since previsit or office visit. Maw   Pt's husband, Denina Rieger, [phone] cell if need to reach him.  He is in [place] for work now per pt. Corky Sing

## 2017-03-09 NOTE — Progress Notes (Signed)
Alert and oriented x3, pleased with MAC, report to RN Judson Roch

## 2017-03-09 NOTE — Patient Instructions (Signed)
YOU HAD AN ENDOSCOPIC PROCEDURE TODAY AT [facility]:   Refer to the procedure report that was given to you for any specific questions about what was found during the examination.  If the procedure report does not answer your questions, please call your gastroenterologist to clarify.  If you requested that your care partner not be given the details of your procedure findings, then the procedure report has been included in a sealed envelope for you to review at your convenience later.  YOU SHOULD EXPECT: Some feelings of bloating in the abdomen. Passage of more gas than usual.  Walking can help get rid of the air that was put into your GI tract during the procedure and reduce the bloating. If you had a lower endoscopy (such as a colonoscopy or flexible sigmoidoscopy) you may notice spotting of blood in your stool or on the toilet paper. If you underwent a bowel prep for your procedure, you may not have a normal bowel movement for a few days.  Please Note:  You might notice some irritation and congestion in your nose or some drainage.  This is from the oxygen used during your procedure.  There is no need for concern and it should clear up in a day or so.  SYMPTOMS TO REPORT IMMEDIATELY:   Following lower endoscopy (colonoscopy or flexible sigmoidoscopy):  Excessive amounts of blood in the stool  Significant tenderness or worsening of abdominal pains  Swelling of the abdomen that is new, acute  Fever of 100F or higher   For urgent or emergent issues, a gastroenterologist can be reached at any hour by calling [phone].   DIET:  We do recommend a small meal at first, but then you may proceed to your regular diet.  Drink plenty of fluids but you should avoid alcoholic beverages for 24 hours.  ACTIVITY:  You should plan to take it easy for the rest of today and you should NOT DRIVE or use heavy machinery until tomorrow (because of the sedation medicines used during the test).     FOLLOW UP: Our staff will call the number listed on your records the next business day following your procedure to check on you and address any questions or concerns that you may have regarding the information given to you following your procedure. If we do not reach you, we will leave a message.  However, if you are feeling well and you are not experiencing any problems, there is no need to return our call.  We will assume that you have returned to your regular daily activities without incident.  If any biopsies were taken you will be contacted by phone or by letter within the next 1-3 weeks.  Please call us at [phone] if you have not heard about the biopsies in 3 weeks.    SIGNATURES/CONFIDENTIALITY: You and/or your care partner have signed paperwork which will be entered into your electronic medical record.  These signatures attest to the fact that that the information above on your After Visit Summary has been reviewed and is understood.  Full responsibility of the confidentiality of this discharge information lies with you and/or your care-partner.  NO ASPIRIN, ASPIRIN CONTAINING PRODUCTS (BC OR GOODY POWDERS) OR NSAIDS (IBUPROFEN, ADVIL, ALEVE, AND MOTRIN) FOR  2 weeks TYLENOL IS OK TO TAKE Resume remainder of medications.Information given on polyps and hemorrhoids.

## 2017-03-09 NOTE — Progress Notes (Signed)
Called to room to assist during endoscopic procedure.  Patient ID and intended procedure confirmed with present staff. Received instructions for my participation in the procedure from the performing physician.  

## 2017-03-09 NOTE — Op Note (Signed)
[facility] Patient Name: [name] Procedure Date: 03/09/2017 2:03 PM MRN: [id number] Endoscopist: Mauri Pole , MD Age: 66 Referring MD:  Date of Birth: 1951/01/13 Gender: Female Account #: [id number] Procedure:                Colonoscopy Indications:              Screening for colorectal malignant neoplasm, Last                            colonoscopy: 2007 Medicines:                Monitored Anesthesia Care Procedure:                Pre-Anesthesia Assessment:                           - Prior to the procedure, a History and Physical                            was performed, and patient medications and                            allergies were reviewed. The patient's tolerance of                            previous anesthesia was also reviewed. The risks                            and benefits of the procedure and the sedation                            options and risks were discussed with the patient.                            All questions were answered, and informed consent                            was obtained. Prior Anticoagulants: The patient has                            taken no previous anticoagulant or antiplatelet                            agents. ASA Grade Assessment: II - A patient with                            mild systemic disease. After reviewing the risks                            and benefits, the patient was deemed in                            satisfactory condition to undergo the procedure.  After obtaining informed consent, the colonoscope                            was passed under direct vision. Throughout the                            procedure, the patient's blood pressure, pulse, and                            oxygen saturations were monitored continuously. The                            Colonoscope was introduced through the anus and                            advanced to the the cecum, identified  by                            appendiceal orifice and ileocecal valve. The                            colonoscopy was performed without difficulty. The                            patient tolerated the procedure well. The quality                            of the bowel preparation was excellent. The                            ileocecal valve, appendiceal orifice, and rectum                            were photographed. Scope In: 2:15:50 PM Scope Out: 2:38:55 PM Scope Withdrawal Time: 0 hours 18 minutes 47 seconds  Total Procedure Duration: 0 hours 23 minutes 5 seconds  Findings:                 The perianal and digital rectal examinations were                            normal.                           A 15 mm polyp was found in the transverse colon.                            The polyp was flat. The polyp was removed with a                            piecemeal technique using a cold snare. Resection                            and retrieval were complete.  Non-bleeding internal hemorrhoids were found during                            retroflexion. The hemorrhoids were small. Complications:            No immediate complications. Estimated Blood Loss:     Estimated blood loss was minimal. Impression:               - One 15 mm polyp in the transverse colon, removed                            piecemeal using a cold snare. Resected and                            retrieved.                           - Non-bleeding internal hemorrhoids. Recommendation:           - Patient has a contact number available for                            emergencies. The signs and symptoms of potential                            delayed complications were discussed with the                            patient. Return to normal activities tomorrow.                            Written discharge instructions were provided to the                            patient.                           -  Resume previous diet.                           - Continue present medications.                           - Await pathology results.                           - Repeat colonoscopy in 3 - 5 years for                            surveillance based on pathology results. Mauri Pole, MD 03/09/2017 2:45:51 PM This report has been signed electronically.

## 2017-03-10 ENCOUNTER — Telehealth: Payer: Self-pay

## 2017-03-10 ENCOUNTER — Telehealth: Payer: Self-pay | Admitting: Gastroenterology

## 2017-03-10 NOTE — Telephone Encounter (Signed)
Left message on answering machine. 

## 2017-03-10 NOTE — Telephone Encounter (Signed)
  Follow up Call-  Call back number 03/09/2017  Post procedure Call Back phone  # #[phone] cell  Permission to leave phone message Yes  Some recent data might be hidden     Patient questions:  Do you have a fever, pain , or abdominal swelling? No. Pain Score  0 *  Have you tolerated food without any problems? Yes.    Have you been able to return to your normal activities? Yes.    Do you have any questions about your discharge instructions: Diet   No. Medications  No. Follow up visit  No.  Do you have questions or concerns about your Care? No.  Actions: * If pain score is 4 or above: No action needed, pain <4.

## 2017-03-10 NOTE — Telephone Encounter (Signed)
RN spoke with patient at length regarding her procedure report.  She had many questions and requested to speak to the doctor.  C.McCoy, RN spoke with doctor and we will route it to her.

## 2017-03-10 NOTE — Telephone Encounter (Signed)
Called back patient and discussed the reason behind ASA II and post procedure instruction. She had multiple questions which were answered to her satisfaction

## 2017-03-15 ENCOUNTER — Encounter: Payer: Self-pay | Admitting: Gastroenterology

## 2017-03-18 ENCOUNTER — Encounter: Payer: 59 | Admitting: Gastroenterology

## 2017-04-04 ENCOUNTER — Telehealth: Payer: Self-pay | Admitting: Gastroenterology
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
## 2017-09-16 ENCOUNTER — Other Ambulatory Visit: Payer: Self-pay | Admitting: Internal Medicine

## 2017-09-16 NOTE — Telephone Encounter (Signed)
Copied from [place] [id number]. Topic: General - Other >> Sep 15, 2017  2:11 PM Carolyn Stare wrote:   Pt would like a call back to discuss how many pills she would need of the below med for trips that she take    atovaquone-proguanil (MALARONE) 250-100 MG TABS tablet  CIPRO  >> Sep 15, 2017  2:16 PM Carolyn Stare wrote:   Pt also said she need the below med to match up with her meds she has questions    fluconazole (DIFLUCAN) 150 MG tablet >> Sep 15, 2017  4:11 PM Karren Cobble, CMA wrote: [facility] [address]   >> Sep 16, 2017  2:20 PM Scherrie Gerlach wrote: Pt calling back, reached out to practice but Inez Catalina is not in the office today. Pt states they are  Pt is requesting  Malarone 30 pills or (30 day supply) ciprofloxacin (CIPRO) 500 MG tablet  20 tabs fluconazole (DIFLUCAN) 150 MG tablet ibuprofen (ADVIL,MOTRIN) 600 MG tablet   Pt is also requesting the dr put refills on these meds  Pt states she is leaving on 09/19/2017  [id number] [address], [place] (Phone) [phone] (Fax)

## 2017-09-18 ENCOUNTER — Other Ambulatory Visit: Payer: Self-pay | Admitting: Internal Medicine

## 2017-09-19 NOTE — Telephone Encounter (Signed)
MD called pt sent rx for Cipro instead...[name]

## 2017-11-21 ENCOUNTER — Other Ambulatory Visit: Payer: Self-pay | Admitting: Internal Medicine

## 2017-11-23 ENCOUNTER — Other Ambulatory Visit: Payer: Self-pay | Admitting: Internal Medicine

## 2018-01-11 ENCOUNTER — Encounter: Payer: 59 | Admitting: Internal Medicine

## 2018-01-11 ENCOUNTER — Telehealth: Payer: Self-pay | Admitting: Internal Medicine

## 2018-01-11 DIAGNOSIS — Z0000 Encounter for general adult medical examination without abnormal findings: Secondary | ICD-10-CM

## 2018-01-11 NOTE — Telephone Encounter (Signed)
Patient would like to have her labs done prior to her physical next week.. Can orders be put in for her? She would like a call back.

## 2018-01-12 NOTE — Telephone Encounter (Signed)
LM notifying labs were entered

## 2018-01-17 ENCOUNTER — Other Ambulatory Visit (INDEPENDENT_AMBULATORY_CARE_PROVIDER_SITE_OTHER): Payer: 59

## 2018-01-17 ENCOUNTER — Encounter: Payer: Self-pay | Admitting: Internal Medicine

## 2018-01-17 ENCOUNTER — Ambulatory Visit (INDEPENDENT_AMBULATORY_CARE_PROVIDER_SITE_OTHER): Payer: 59 | Admitting: Internal Medicine

## 2018-01-17 VITALS — BP 126/82 | HR 63 | Temp 97.9°F | Ht 64.0 in | Wt 152.0 lb

## 2018-01-17 DIAGNOSIS — Z23 Encounter for immunization: Secondary | ICD-10-CM

## 2018-01-17 DIAGNOSIS — Z0000 Encounter for general adult medical examination without abnormal findings: Secondary | ICD-10-CM

## 2018-01-17 DIAGNOSIS — Z299 Encounter for prophylactic measures, unspecified: Secondary | ICD-10-CM

## 2018-01-17 LAB — CBC WITH DIFFERENTIAL/PLATELET
BASOS PCT: 0.7 % (ref 0.0–3.0)
Basophils Absolute: 0 10*3/uL (ref 0.0–0.1)
EOS PCT: 2.3 % (ref 0.0–5.0)
Eosinophils Absolute: 0.2 10*3/uL (ref 0.0–0.7)
HCT: 42.8 % (ref 36.0–46.0)
HEMOGLOBIN: 14.3 g/dL (ref 12.0–15.0)
LYMPHS ABS: 2.8 10*3/uL (ref 0.7–4.0)
Lymphocytes Relative: 41.9 % (ref 12.0–46.0)
MCHC: 33.5 g/dL (ref 30.0–36.0)
MCV: 90.3 fl (ref 78.0–100.0)
Monocytes Absolute: 0.4 10*3/uL (ref 0.1–1.0)
Monocytes Relative: 6.2 % (ref 3.0–12.0)
Neutro Abs: 3.3 10*3/uL (ref 1.4–7.7)
Neutrophils Relative %: 48.9 % (ref 43.0–77.0)
Platelets: 297 10*3/uL (ref 150.0–400.0)
RBC: 4.73 Mil/uL (ref 3.87–5.11)
RDW: 12.9 % (ref 11.5–15.5)
WBC: 6.7 10*3/uL (ref 4.0–10.5)

## 2018-01-17 LAB — COMPREHENSIVE METABOLIC PANEL
ALK PHOS: 66 U/L (ref 39–117)
ALT: 13 U/L (ref 0–35)
AST: 16 U/L (ref 0–37)
Albumin: 4.6 g/dL (ref 3.5–5.2)
BUN: 13 mg/dL (ref 6–23)
CHLORIDE: 101 meq/L (ref 96–112)
CO2: 29 mEq/L (ref 19–32)
Calcium: 9.8 mg/dL (ref 8.4–10.5)
Creatinine, Ser: 0.81 mg/dL (ref 0.40–1.20)
GFR: 75.31 mL/min (ref >60.00)
GLUCOSE: 101 mg/dL — AB (ref 70–99)
POTASSIUM: 4 meq/L (ref 3.5–5.1)
SODIUM: 138 meq/L (ref 135–145)
TOTAL PROTEIN: 7.8 g/dL (ref 6.0–8.3)
Total Bilirubin: 0.5 mg/dL (ref 0.2–1.2)

## 2018-01-17 LAB — URINALYSIS
Bilirubin Urine: NEGATIVE
Hgb urine dipstick: NEGATIVE
Ketones, ur: NEGATIVE
LEUKOCYTES UA: NEGATIVE
NITRITE: NEGATIVE
PH: 6 (ref 5.0–8.0)
SPECIFIC GRAVITY, URINE: 1.015 (ref 1.000–1.030)
Total Protein, Urine: NEGATIVE
Urine Glucose: NEGATIVE
Urobilinogen, UA: 0.2 (ref 0.0–1.0)

## 2018-01-17 LAB — LIPID PANEL
CHOLESTEROL: 192 mg/dL (ref 0–200)
HDL: 69.5 mg/dL (ref >39.00)
LDL Cholesterol: 108 mg/dL — ABNORMAL HIGH (ref 0–99)
NONHDL: 122.15
TRIGLYCERIDES: 70 mg/dL (ref 0.0–149.0)
Total CHOL/HDL Ratio: 3
VLDL: 14 mg/dL (ref 0.0–40.0)

## 2018-01-17 LAB — HEPATIC FUNCTION PANEL
ALBUMIN: 4.6 g/dL (ref 3.5–5.2)
ALT: 13 U/L (ref 0–35)
AST: 16 U/L (ref 0–37)
Alkaline Phosphatase: 66 U/L (ref 39–117)
Bilirubin, Direct: 0.1 mg/dL (ref 0.0–0.3)
Total Bilirubin: 0.5 mg/dL (ref 0.2–1.2)
Total Protein: 7.8 g/dL (ref 6.0–8.3)

## 2018-01-17 LAB — TSH: TSH: 2.12 u[IU]/mL (ref 0.35–4.50)

## 2018-01-17 MED ORDER — MENINGOCOCCAL A C Y&W-135 OLIG IM SOLR: 0.5000 mL | Freq: Once | INTRAMUSCULAR | Status: DC

## 2018-01-17 MED ORDER — ZOSTER VAC RECOMB ADJUVANTED 50 MCG/0.5ML IM SUSR: 0.5000 mL | Freq: Once | INTRAMUSCULAR | 1 refills | Status: AC

## 2018-01-17 NOTE — Assessment & Plan Note (Addendum)
We discussed age appropriate health related issues, including available/recomended screening tests and vaccinations. We discussed a need for adhering to healthy diet and exercise. Labs/EKG were reviewed/ordered. All questions were answered. GYN/mammo q 12 mo Colon at 67 yo Prevnar Meningiococcal vaccine for travel Shingrix Rx

## 2018-01-17 NOTE — Progress Notes (Signed)
Subjective:  Patient ID: Anyssa Sharpless. Hennings, female    DOB: February 27, 1951  Age: 67 y.o. MRN: [id number]  CC: No chief complaint on file.   HPI Shifa Brisbon. Vadala presents for well exam  Outpatient Medications Prior to Visit  Medication Sig Dispense Refill  . calcium citrate-vitamin D (CITRACAL+D) 315-200 MG-UNIT tablet Take 1 tablet by mouth daily.    . Cholecalciferol (VITAMIN D3) 2000 units capsule Take 1 capsule (2,000 Units total) by mouth daily. 100 capsule 3  . ciprofloxacin (CIPRO) 500 MG tablet Take 1 tablet (500 mg total) by mouth 2 (two) times daily. 20 tablet 0  . fluconazole (DIFLUCAN) 150 MG tablet TAKE 1 TABLET (150 MG TOTAL) BY MOUTH ONCE. 3 tablet 0  . ibuprofen (ADVIL,MOTRIN) 600 MG tablet 1 TABLET BY MOUTH EVERY 6HRS, NOT TO EXCEED 3200MG  PER DAY. 30 tablet 2  . atovaquone-proguanil (MALARONE) 250-100 MG TABS tablet TAKE 1 -2 TABLET BY MOUTH BEFORE TRIP, DURING THE TRIP AND 7 DAYS AFTER TRIP. (Patient not taking: Reported on 03/04/2017) 35 tablet 0  . atovaquone-proguanil (MALARONE) 250-100 MG TABS tablet TAKE 1 -2 TABLET BY MOUTH BEFORE TRIP, DURING THE TRIP AND 7 DAYS AFTER TRIP. (Patient not taking: Reported on 01/17/2018) 35 tablet 0  . fluconazole (DIFLUCAN) 150 MG tablet TAKE 1 TABLET (150 MG TOTAL) BY MOUTH ONCE.REPEAT IF NEEDED (Patient not taking: Reported on 01/17/2018) 3 tablet 0  . KETOCONAZOLE, TOPICAL, 1 % SHAM Use on hair 2/week (Patient not taking: Reported on 01/10/2017) 200 mL 2  . triamcinolone cream (KENALOG) 0.5 % USE TWO TIMES A DAY AS NEEDED (Patient not taking: Reported on 03/04/2017) 45 g 0  . BIOTIN PO Take 1 capsule by mouth daily.    . ciprofloxacin (CIPRO) 500 MG tablet TAKE 1 TABLET BY MOUTH TWICE A DAY 20 tablet 0  . fluconazole (DIFLUCAN) 150 MG tablet TAKE 1 TABLET ONCE. MAY REPEAT IN 2 DAYS.. 30 tablet 0   Facility-Administered Medications Prior to Visit  Medication Dose Route Frequency Provider Last Rate Last Dose  . 0.9 %  sodium chloride  infusion  500 mL Intravenous Continuous Nandigam, Kavitha V, MD        ROS Review of Systems  Constitutional: Negative for activity change, appetite change, chills, fatigue and unexpected weight change.  HENT: Negative for congestion, mouth sores and sinus pressure.   Eyes: Negative for visual disturbance.  Respiratory: Negative for cough and chest tightness.   Gastrointestinal: Negative for abdominal pain and nausea.  Genitourinary: Negative for difficulty urinating, frequency and vaginal pain.  Musculoskeletal: Negative for back pain and gait problem.  Skin: Negative for pallor and rash.  Neurological: Negative for dizziness, tremors, weakness, numbness and headaches.  Psychiatric/Behavioral: Negative for confusion, sleep disturbance and suicidal ideas.    Objective:  BP 126/82 (BP Location: Left Arm, Patient Position: Sitting, Cuff Size: Normal)   Pulse 63   Temp 97.9 F (36.6 C) (Oral)   Ht 5\' 4"  (1.626 m)   Wt 152 lb (68.9 kg)   SpO2 99%   BMI 26.09 kg/m   BP Readings from Last 3 Encounters:  01/17/18 126/82  03/09/17 128/78  01/10/17 138/82    Wt Readings from Last 3 Encounters:  01/17/18 152 lb (68.9 kg)  03/09/17 143 lb (64.9 kg)  03/04/17 143 lb 12.8 oz (65.2 kg)    Physical Exam  Constitutional: She appears well-developed. No distress.  HENT:  Head: Normocephalic.  Right Ear: External ear normal.  Left Ear: External ear normal.  Nose: Nose normal.  Mouth/Throat: Oropharynx is clear and moist.  Eyes: Pupils are equal, round, and reactive to light. Conjunctivae are normal. Right eye exhibits no discharge. Left eye exhibits no discharge.  Neck: Normal range of motion. Neck supple. No JVD present. No tracheal deviation present. No thyromegaly present.  Cardiovascular: Normal rate, regular rhythm and normal heart sounds.  Pulmonary/Chest: No stridor. No respiratory distress. She has no wheezes.  Abdominal: Soft. Bowel sounds are normal. She exhibits no  distension and no mass. There is no tenderness. There is no rebound and no guarding.  Musculoskeletal: She exhibits no edema or tenderness.  Lymphadenopathy:    She has no cervical adenopathy.  Neurological: She displays normal reflexes. No cranial nerve deficit. She exhibits normal muscle tone. Coordination normal.  Skin: No rash noted. No erythema.  Psychiatric: She has a normal mood and affect. Her behavior is normal. Judgment and thought content normal.    Lab Results  Component Value Date   WBC 6.3 12/27/2016   HGB 12.9 12/27/2016   HCT 38.7 12/27/2016   PLT 279.0 12/27/2016   GLUCOSE 98 12/27/2016   CHOL 153 12/27/2016   TRIG 53.0 12/27/2016   HDL 59.30 12/27/2016   LDLDIRECT 139.0 04/07/2009   LDLCALC 83 12/27/2016   ALT 16 12/27/2016   AST 19 12/27/2016   NA 140 12/27/2016   K 3.9 12/27/2016   CL 102 12/27/2016   CREATININE 0.77 12/27/2016   BUN 19 12/27/2016   CO2 30 12/27/2016   TSH 2.34 12/27/2016    Dg Bone Density  Result Date: 07/07/2016 EXAM: DUAL X-RAY ABSORPTIOMETRY (DXA) FOR BONE MINERAL DENSITY IMPRESSION: Referring Physician:  Brien Few PATIENT: Name: ALLEAH, DEARMAN Patient ID: [id number] Birth Date: 09-Feb-1951 Height: 64.0 in. Sex: Female Measured: 07/07/2016 Weight: 150.0 lbs. Indications: Caucasian, Estrogen Deficient, Postmenopausal Fractures: None Treatments: Calcium (E943.0), Vitamin D (E933.5) ASSESSMENT: The BMD measured at AP Spine L1-L4 is 0.985 g/cm2 with a T-score of -1.7. This patient is considered osteopenic according to [facility]) criteria. There has been a statistically significant decrease in BMD of left hip and no statistically significant change in lumbar spine since prior exam dated 06/15/2006. Site Region Measured Date Measured Age YA BMD Significant CHANGE T-score AP Spine  L1-L4      8/07[age]    63.9         -1.7    0.985 g/cm2 DualFemur Neck Right 07/07/2016    63.9         -1.6    0.814 g/cm2 World Health  Organization [facility]) criteria for post-menopausal, Caucasian Women: Normal       T-score at or above -1 SD Osteopenia   T-score between -1 and -2.5 SD Osteoporosis T-score at or below -2.5 SD RECOMMENDATION: [place] recommends that FDA-approved medical therapies be considered in postmenopausal women and men age 6 or older with a: 1. Hip or vertebral (clinical or morphometric) fracture. 2. T-score of <-2.5 at the spine or hip. 3. Ten-year fracture probability by FRAX of 3% or greater for hip fracture or 20% or greater for major osteoporotic fracture. All treatment decisions require clinical judgment and consideration of individual patient factors, including patient preferences, co-morbidities, previous drug use, risk factors not captured in the FRAX model (e.g. falls, vitamin D deficiency, increased bone turnover, interval significant decline in bone density) and possible under - or over-estimation of fracture risk by FRAX. All patients should ensure an adequate intake of dietary calcium (1200 mg/d) and vitamin D (  800 IU daily) unless contraindicated. FOLLOW-UP: People with diagnosed cases of osteoporosis or at high risk for fracture should have regular bone mineral density tests. For patients eligible for Medicare, routine testing is allowed once every 2 years. The testing frequency can be increased to one year for patients who have rapidly progressing disease, those who are receiving or discontinuing medical therapy to restore bone mass, or have additional risk factors. I have reviewed this report, and agree with the above findings. [facility] FRAX* 10-year Probability of Fracture Based on femoral neck BMD: DualFemur (Right) Major Osteoporotic Fracture: 9.2% Hip Fracture:                1.0% Population:                  [place] (Caucasian) Risk Factors:                None *FRAX is a Materials engineer of the [employer] of [employer] for Metabolic Bone Disease, a World  Pharmacologist (WHO) [facility]. ASSESSMENT: The probability of a major osteoporotic fracture is 9.2 % within the next ten years. The probability of a hip fracture is 1.0 % within the next ten years. Electronically Signed   By: Rolm Baptise M.D.   On: 07/07/2016 15:30    Assessment & Plan:   There are no diagnoses linked to this encounter. I have discontinued Jolyssa Oplinger. Malicki's BIOTIN PO. I am also having her maintain her triamcinolone cream, KETOCONAZOLE (TOPICAL), atovaquone-proguanil, ciprofloxacin, fluconazole, Vitamin D3, calcium citrate-vitamin D, fluconazole, atovaquone-proguanil, and ibuprofen. We will continue to administer sodium chloride.  No orders of the defined types were placed in this encounter.    Follow-up: No follow-ups on file.  Walker Kehr, MD

## 2018-01-18 LAB — HIV ANTIBODY (ROUTINE TESTING W REFLEX): HIV 1&2 Ab, 4th Generation: NONREACTIVE

## 2018-01-18 LAB — MEASLES/MUMPS/RUBELLA IMMUNITY
Mumps IgG: 135 AU/mL
RUBELLA: 9.55 {index}

## 2018-01-18 LAB — HEPATITIS C ANTIBODY
HEP C AB: NONREACTIVE
SIGNAL TO CUT-OFF: 0.01 (ref <1.00)

## 2018-01-19 ENCOUNTER — Telehealth: Payer: Self-pay | Admitting: Internal Medicine

## 2018-01-19 NOTE — Telephone Encounter (Signed)
Pt wants to make sure she is on the wait list for Shingrix.

## 2018-01-19 NOTE — Telephone Encounter (Signed)
Patient is on wait list

## 2018-05-02 ENCOUNTER — Telehealth: Payer: Self-pay

## 2018-05-02 MED ORDER — YELLOW FEVER VACCINE IJ SUSR: 1.0000 | Freq: Once | INTRAMUSCULAR | 0 refills | Status: AC

## 2018-09-19 ENCOUNTER — Telehealth: Payer: Self-pay | Admitting: Internal Medicine

## 2018-09-19 NOTE — Telephone Encounter (Signed)
Requested medication (s) are due for refill today: Yes  Requested medication (s) are on the active medication list: Yes  Last refill:  See med list  Future visit scheduled: No  Notes to clinic:  See request    Requested Prescriptions  Pending Prescriptions Disp Refills   fluconazole (DIFLUCAN) 150 MG tablet [Pharmacy Med Name: FLUCONAZOLE 150 MG TABLET] 30 tablet 0    Sig: TAKE 1 TABLET ONCE. MAY REPEAT IN 2 DAYS..     Off-Protocol Failed - 09/19/2018 12:40 PM      Failed - Medication not assigned to a protocol, review manually.      Passed - Valid encounter within last 12 months    Recent Outpatient Visits          8 months ago Need for pneumococcal vaccination   [name], [name], MD   1 year ago Need for hepatitis A and B vaccination   [name], [name], MD   2 years ago Colon cancer screening   [name], [name], MD   2 years ago Seborrheic dermatitis of scalp   [place], [name], MD   3 years ago Well adult exam   [name], [name], MD            atovaquone-proguanil (MALARONE) 250-100 MG TABS tablet [Pharmacy Med Name: ATOVAQUONE-PROGUANIL 250-100] 35 tablet 0    Sig: TAKE 1 -2 TABLET BY MOUTH BEFORE TRIP, DURING THE TRIP AND 7 DAYS AFTER TRIP.     Off-Protocol Failed - 09/19/2018 12:40 PM      Failed - Medication not assigned to a protocol, review manually.      Passed - Valid encounter within last 12 months    Recent Outpatient Visits          8 months ago Need for pneumococcal vaccination   [name], [name], MD   1 year ago Need for hepatitis A and B vaccination   [name], [name], MD   2 years ago Colon cancer screening   [name],  [name], MD   2 years ago Seborrheic dermatitis of scalp   [place], [name], MD   3 years ago Well adult exam   [name], [name], MD            ciprofloxacin (CIPRO) 500 MG tablet [Pharmacy Med Name: CIPROFLOXACIN HCL 500 MG TAB] 20 tablet 0    Sig: TAKE 1 TABLET BY MOUTH TWICE A DAY     Off-Protocol Failed - 09/19/2018 12:40 PM      Failed - Medication not assigned to a protocol, review manually.      Passed - Valid encounter within last 12 months    Recent Outpatient Visits          8 months ago Need for pneumococcal vaccination   [name], [name], MD   1 year ago Need for hepatitis A and B vaccination   [name], [name], MD   2 years ago Colon cancer screening   [name], [name], MD   2 years ago Seborrheic dermatitis of scalp   [name], [name],  MD   3 years ago Well adult exam   [name], [name], MD           Yes

## 2018-09-19 NOTE — Telephone Encounter (Signed)
Copied from [place] [id number]. Topic: Quick Communication - Rx Refill/Question >> Sep 19, 2018 11:24 AM Windy Kalata wrote: Medication: KETOCONAZOLE, TOPICAL, 1 % fluconazole (DIFLUCAN) 150 MG tablet ,  ciprofloxacin (CIPRO) 500 MG tablet, atovaquone-proguanil (MALARONE) 250-100 MG TABS tablet,  ibuprofen (ADVIL,MOTRIN) 600 MG tablet Has the patient contacted their pharmacy? Yes.   (Agent: If no, request that the patient contact the pharmacy for the refill.) (Agent: If yes, when and what did the pharmacy advise?)  Preferred Pharmacy (with phone number or street name): [id number] [address], [place] [phone] (Phone) [phone] (Fax)  Agent: Please be advised that RX refills may take up to 3 business days. We ask that you follow-up with your pharmacy.

## 2018-09-21 NOTE — Telephone Encounter (Signed)
Pt calling to check status ofKETOCONAZOLE, TOPICAL, 1 % SHAM [[id number]]

## 2018-09-21 NOTE — Telephone Encounter (Signed)
RX sent

## 2018-09-22 MED ORDER — KETOCONAZOLE 1 % EX SHAM: MEDICATED_SHAMPOO | CUTANEOUS | 2 refills | Status: DC

## 2018-09-22 NOTE — Telephone Encounter (Signed)
Please advise about changing shampoo RX.

## 2018-09-22 NOTE — Telephone Encounter (Signed)
Pt called and stated that her insurance will only cover 2% for shampoo. Please advise. [phone]

## 2018-09-22 NOTE — Addendum Note (Signed)
Addended by: Karren Cobble on: 09/22/2018 09:01 AM   Modules accepted: Orders

## 2018-09-22 NOTE — Telephone Encounter (Signed)
RX sent

## 2018-09-25 MED ORDER — KETOCONAZOLE 2 % EX SHAM: 1.0000 "application " | MEDICATED_SHAMPOO | CUTANEOUS | 3 refills | Status: DC

## 2018-09-25 NOTE — Telephone Encounter (Addendum)
Ok 2% shampoo - done Thx

## 2018-09-25 NOTE — Telephone Encounter (Signed)
Pt.notified

## 2018-09-25 NOTE — Addendum Note (Signed)
Addended by: Cassandria Anger on: 09/25/2018 03:46 PM   Modules accepted: Orders

## 2018-10-18 NOTE — Telephone Encounter (Signed)
error 

## 2018-12-01 ENCOUNTER — Other Ambulatory Visit: Payer: Self-pay | Admitting: Internal Medicine

## 2018-12-05 ENCOUNTER — Other Ambulatory Visit: Payer: Self-pay | Admitting: Internal Medicine

## 2019-01-14 ENCOUNTER — Emergency Department (HOSPITAL_COMMUNITY): Payer: Medicare Other

## 2019-01-14 ENCOUNTER — Emergency Department (HOSPITAL_COMMUNITY): Payer: Medicare Other | Admitting: Certified Registered"

## 2019-01-14 ENCOUNTER — Other Ambulatory Visit: Payer: Self-pay

## 2019-01-14 ENCOUNTER — Encounter (HOSPITAL_COMMUNITY): Payer: Self-pay | Admitting: Emergency Medicine

## 2019-01-14 ENCOUNTER — Ambulatory Visit (HOSPITAL_COMMUNITY)
Admission: EM | Admit: 2019-01-14 | Discharge: 2019-01-15 | Disposition: A | Discharge status: Home / Self Care | Payer: Medicare Other | Attending: Otolaryngology | Admitting: Otolaryngology

## 2019-01-14 ENCOUNTER — Encounter (HOSPITAL_COMMUNITY)
Admission: EM | Disposition: A | Discharge status: Home / Self Care | Payer: Self-pay | Source: Home / Self Care | Attending: Emergency Medicine

## 2019-01-14 DIAGNOSIS — Z886 Allergy status to analgesic agent status: Secondary | ICD-10-CM | POA: Diagnosis not present

## 2019-01-14 DIAGNOSIS — Z808 Family history of malignant neoplasm of other organs or systems: Secondary | ICD-10-CM | POA: Insufficient documentation

## 2019-01-14 DIAGNOSIS — R0989 Other specified symptoms and signs involving the circulatory and respiratory systems: Secondary | ICD-10-CM | POA: Diagnosis not present

## 2019-01-14 DIAGNOSIS — Z888 Allergy status to other drugs, medicaments and biological substances status: Secondary | ICD-10-CM | POA: Insufficient documentation

## 2019-01-14 DIAGNOSIS — K219 Gastro-esophageal reflux disease without esophagitis: Secondary | ICD-10-CM | POA: Diagnosis not present

## 2019-01-14 DIAGNOSIS — L570 Actinic keratosis: Secondary | ICD-10-CM | POA: Diagnosis not present

## 2019-01-14 DIAGNOSIS — T17308A Unspecified foreign body in larynx causing other injury, initial encounter: Secondary | ICD-10-CM | POA: Diagnosis not present

## 2019-01-14 DIAGNOSIS — T188XXA Foreign body in other parts of alimentary tract, initial encounter: Secondary | ICD-10-CM | POA: Insufficient documentation

## 2019-01-14 DIAGNOSIS — L821 Other seborrheic keratosis: Secondary | ICD-10-CM | POA: Diagnosis not present

## 2019-01-14 DIAGNOSIS — Z9049 Acquired absence of other specified parts of digestive tract: Secondary | ICD-10-CM | POA: Diagnosis not present

## 2019-01-14 DIAGNOSIS — Z8249 Family history of ischemic heart disease and other diseases of the circulatory system: Secondary | ICD-10-CM | POA: Insufficient documentation

## 2019-01-14 DIAGNOSIS — X58XXXA Exposure to other specified factors, initial encounter: Secondary | ICD-10-CM | POA: Diagnosis not present

## 2019-01-14 DIAGNOSIS — T17208A Unspecified foreign body in pharynx causing other injury, initial encounter: Secondary | ICD-10-CM | POA: Diagnosis not present

## 2019-01-14 DIAGNOSIS — T17228A Food in pharynx causing other injury, initial encounter: Secondary | ICD-10-CM | POA: Insufficient documentation

## 2019-01-14 DIAGNOSIS — Z881 Allergy status to other antibiotic agents status: Secondary | ICD-10-CM | POA: Insufficient documentation

## 2019-01-14 HISTORY — PX: DIRECT LARYNGOSCOPY: SHX5326

## 2019-01-14 HISTORY — PX: FOREIGN BODY REMOVAL ESOPHAGEAL: SHX5322

## 2019-01-14 SURGERY — LARYNGOSCOPY, DIRECT
Anesthesia: General

## 2019-01-14 MED ORDER — LACTATED RINGERS IV SOLN
INTRAVENOUS | Status: DC | PRN
  Administered 2019-01-14: 23:00:00 via INTRAVENOUS

## 2019-01-14 MED ORDER — SODIUM CHLORIDE 0.9 % IV BOLUS: 500.0000 mL | Freq: Once | INTRAVENOUS | Status: DC

## 2019-01-14 MED ORDER — PROPOFOL 10 MG/ML IV BOLUS
INTRAVENOUS | Status: AC
  Filled 2019-01-14: qty 20

## 2019-01-14 MED ORDER — LIDOCAINE 2% (20 MG/ML) 5 ML SYRINGE
INTRAMUSCULAR | Status: DC | PRN
  Administered 2019-01-14: 60 mg via INTRAVENOUS

## 2019-01-14 MED ORDER — ONDANSETRON HCL 4 MG/2ML IJ SOLN
INTRAMUSCULAR | Status: DC | PRN
  Administered 2019-01-14: 4 mg via INTRAVENOUS

## 2019-01-14 MED ORDER — MIDAZOLAM HCL 5 MG/5ML IJ SOLN
INTRAMUSCULAR | Status: DC | PRN
  Administered 2019-01-14: 2 mg via INTRAVENOUS

## 2019-01-14 MED ORDER — FENTANYL CITRATE (PF) 250 MCG/5ML IJ SOLN
INTRAMUSCULAR | Status: AC
  Filled 2019-01-14: qty 5

## 2019-01-14 MED ORDER — DEXAMETHASONE SODIUM PHOSPHATE 10 MG/ML IJ SOLN
INTRAMUSCULAR | Status: DC | PRN
  Administered 2019-01-14: 10 mg via INTRAVENOUS

## 2019-01-14 MED ORDER — EPINEPHRINE HCL (NASAL) 0.1 % NA SOLN
NASAL | Status: AC
  Filled 2019-01-14: qty 30

## 2019-01-14 MED ORDER — OXYMETAZOLINE HCL 0.05 % NA SOLN
NASAL | Status: AC
  Filled 2019-01-14: qty 30

## 2019-01-14 MED ORDER — FENTANYL CITRATE (PF) 100 MCG/2ML IJ SOLN
INTRAMUSCULAR | Status: DC | PRN
  Administered 2019-01-14: 100 ug via INTRAVENOUS

## 2019-01-14 MED ORDER — SUCCINYLCHOLINE CHLORIDE 20 MG/ML IJ SOLN
INTRAMUSCULAR | Status: DC | PRN
  Administered 2019-01-14: 100 mg via INTRAVENOUS

## 2019-01-14 MED ORDER — PROPOFOL 10 MG/ML IV BOLUS
INTRAVENOUS | Status: DC | PRN
  Administered 2019-01-14: 150 mg via INTRAVENOUS

## 2019-01-14 MED ORDER — MIDAZOLAM HCL 2 MG/2ML IJ SOLN
INTRAMUSCULAR | Status: AC
  Filled 2019-01-14: qty 2

## 2019-01-14 SURGICAL SUPPLY — 28 items
BALLN PULM 15 16.5 18 X 75CM (BALLOONS)
BALLN PULM 15 16.5 18X75 (BALLOONS)
BALLOON PULM 15 16.5 18X75 (BALLOONS) IMPLANT
CANISTER SUCT 3000ML PPV (MISCELLANEOUS) ×4 IMPLANT
CONT SPEC 4OZ CLIKSEAL STRL BL (MISCELLANEOUS) IMPLANT
COVER BACK TABLE 60X90IN (DRAPES) ×4 IMPLANT
COVER MAYO STAND STRL (DRAPES) ×4 IMPLANT
COVER WAND RF STERILE (DRAPES) ×2 IMPLANT
CRADLE DONUT ADULT HEAD (MISCELLANEOUS) IMPLANT
DRAPE HALF SHEET 40X57 (DRAPES) ×4 IMPLANT
GAUZE 4X4 16PLY RFD (DISPOSABLE) ×4 IMPLANT
GLOVE BIO SURGEON STRL SZ7.5 (GLOVE) ×4 IMPLANT
GOWN STRL REUS W/ TWL LRG LVL3 (GOWN DISPOSABLE) IMPLANT
GOWN STRL REUS W/TWL LRG LVL3 (GOWN DISPOSABLE) ×8
GUARD TEETH (MISCELLANEOUS) ×4 IMPLANT
KIT TURNOVER KIT B (KITS) ×4 IMPLANT
NDL HYPO 25GX1X1/2 BEV (NEEDLE) IMPLANT
NDL TRANS ORAL INJECTION (NEEDLE) IMPLANT
NEEDLE HYPO 25GX1X1/2 BEV (NEEDLE) IMPLANT
NEEDLE TRANS ORAL INJECTION (NEEDLE) IMPLANT
NS IRRIG 1000ML POUR BTL (IV SOLUTION) ×4 IMPLANT
PAD ARMBOARD 7.5X6 YLW CONV (MISCELLANEOUS) ×4 IMPLANT
PATTIES SURGICAL .5 X3 (DISPOSABLE) IMPLANT
SOLUTION ANTI FOG 6CC (MISCELLANEOUS) IMPLANT
SURGILUBE 2OZ TUBE FLIPTOP (MISCELLANEOUS) IMPLANT
TOWEL OR 17X24 6PK STRL BLUE (TOWEL DISPOSABLE) ×8 IMPLANT
TUBE CONNECTING 12'X1/4 (SUCTIONS) ×1
TUBE CONNECTING 12X1/4 (SUCTIONS) ×3 IMPLANT

## 2019-01-14 NOTE — Anesthesia Preprocedure Evaluation (Addendum)
Anesthesia Evaluation  Patient identified by MRN, date of birth, ID band Patient awake    Reviewed: Allergy & Precautions, NPO status , Patient's Chart, lab work & pertinent test results  History of Anesthesia Complications Negative for: history of anesthetic complications  Airway Mallampati: II  TM Distance: >3 FB Neck ROM: Full    Dental  (+) Teeth Intact, Dental Advisory Given   Pulmonary neg pulmonary ROS,    breath sounds clear to auscultation       Cardiovascular negative cardio ROS   Rhythm:Regular     Neuro/Psych negative neurological ROS  negative psych ROS   GI/Hepatic Neg liver ROS, GERD  ,Foreign Body Esophageal   Endo/Other  negative endocrine ROS  Renal/GU negative Renal ROS     Musculoskeletal negative musculoskeletal ROS (+)   Abdominal   Peds  Hematology negative hematology ROS (+)   Anesthesia Other Findings   Reproductive/Obstetrics                             Anesthesia Physical Anesthesia Plan  ASA: II  Anesthesia Plan: General   Post-op Pain Management:    Induction: Intravenous  PONV Risk Score and Plan: 3 and Ondansetron and Dexamethasone  Airway Management Planned: Mask  Additional Equipment: None  Intra-op Plan:   Post-operative Plan:   Informed Consent: I have reviewed the patients History and Physical, chart, labs and discussed the procedure including the risks, benefits and alternatives for the proposed anesthesia with the patient or authorized representative who has indicated his/her understanding and acceptance.     Dental advisory given  Plan Discussed with: CRNA and Surgeon  Anesthesia Plan Comments:         Anesthesia Quick Evaluation

## 2019-01-14 NOTE — Anesthesia Procedure Notes (Signed)
Procedure Name: Intubation Date/Time: 01/14/2019 11:10 PM Performed by: Babs Bertin, CRNA Pre-anesthesia Checklist: Patient identified, Emergency Drugs available, Suction available and Patient being monitored Patient Re-evaluated:Patient Re-evaluated prior to induction Oxygen Delivery Method: Circle System Utilized Preoxygenation: Pre-oxygenation with 100% oxygen Induction Type: IV induction Ventilation: Mask ventilation without difficulty Laryngoscope Size: Mac and 3 Grade View: Grade II Tube type: Oral Tube size: 7.0 (placed by Dr. Redmond Baseman) mm Number of attempts: 1 Airway Equipment and Method: Stylet and Oral airway Placement Confirmation: ETT inserted through vocal cords under direct vision,  positive ETCO2 and breath sounds checked- equal and bilateral Secured at: 22 cm Tube secured with: Tape Dental Injury: Teeth and Oropharynx as per pre-operative assessment

## 2019-01-14 NOTE — Brief Op Note (Signed)
01/14/2019  11:20 PM  PATIENT:  [name]. Scala  68 y.o. female  PRE-OPERATIVE DIAGNOSIS:  Foreign Body in pharynx  POST-OPERATIVE DIAGNOSIS:  PHARYNGEAL FOREIGN BODY  PROCEDURE:  Procedure(s): DIRECT LARYNGOSCOPY (N/A) Removal Foreign Body Esophageal  SURGEON:  Surgeon(s) and Role:    * Melida Quitter, MD - Primary  PHYSICIAN ASSISTANT:   ASSISTANTS: none   ANESTHESIA:   general  EBL: Minimal  BLOOD ADMINISTERED:none  DRAINS: none   LOCAL MEDICATIONS USED:  NONE  SPECIMEN:  No Specimen  DISPOSITION OF SPECIMEN:  N/A  COUNTS:  YES  TOURNIQUET:  * No tourniquets in log *  DICTATION: .Other Dictation: Dictation Number [id number]  PLAN OF CARE: Discharge to home after PACU  PATIENT DISPOSITION:  PACU - hemodynamically stable.   Delay start of Pharmacological VTE agent (>24hrs) due to surgical blood loss or risk of bleeding: no

## 2019-01-14 NOTE — H&P (Signed)
[name] is an 68 y.o. female.   Chief Complaint: Pharyngeal foreign body HPI: 68 year old female swallowed a chicken bone at about 7pm and it has remained in place.  She denies bleeding, significant breathing difficulty, or voice change.  Symptoms are unchanged and pretty severe.  Past Medical History:  Diagnosis Date  . Anemia    with a pregnancy   . GERD (gastroesophageal reflux disease)    pt denies this     Past Surgical History:  Procedure Laterality Date  . APPENDECTOMY    . CHOLECYSTECTOMY    . COLONOSCOPY  08/23/2006   DB- normal   . TONSILLECTOMY      Family History  Problem Relation Age of Onset  . Aneurysm Father        AAA  . Cancer Brother 50       eye melanoma  . Colon cancer Neg Hx   . Esophageal cancer Neg Hx   . Rectal cancer Neg Hx   . Stomach cancer Neg Hx   . Pancreatic cancer Neg Hx    Social History:  reports that she has never smoked. She has never used smokeless tobacco. She reports that she does not drink alcohol or use drugs.  Allergies:  Allergies  Allergen Reactions  . Cefaclor Rash    Pt took ibuprofen 600 mg along with the cefaclor.  Not really sure what caused reaction per pt.    (Not in a hospital admission)   No results found for this or any previous visit (from the past 48 hour(s)). Dg Neck Soft Tissue  Result Date: 01/14/2019 CLINICAL DATA:  Pain after choking on a chicken bone. EXAM: NECK SOFT TISSUES - 1+ VIEW COMPARISON:  None. FINDINGS: Two view exam of the neck shows a linear apparent foreign body projecting over the hypopharynx on lateral film, superimposed on the epiglottis. As no calcified normal anatomy exists at this location in this does not appear to represent vascular calcification, ingested chicken bone would be a distinct consideration. This cannot be definitely localized on the frontal projection. There is no gas in the prevertebral soft tissues and no prevertebral soft tissue swelling. IMPRESSION: 19 mm  linear apparent foreign body in the hypopharynx appears calcified and is suspicious for a chicken bone. Results discussed with Dr. Reather Converse at the time of study interpretation. Electronically Signed   By: Misty Stanley M.D.   On: 01/14/2019 22:05    Review of Systems  HENT: Positive for sore throat.   All other systems reviewed and are negative.   Blood pressure (!) 148/74, pulse 67, temperature 98.6 F (37 C), temperature source Oral, resp. rate 16, SpO2 98 %. Physical Exam  Constitutional: She is oriented to person, place, and time. She appears well-developed and well-nourished. No distress.  HENT:  Head: Normocephalic and atraumatic.  Right Ear: External ear normal.  Left Ear: External ear normal.  Nose: Nose normal.  Mouth/Throat: Oropharynx is clear and moist.  Eyes: Pupils are equal, round, and reactive to light. Conjunctivae and EOM are normal.  Neck: Normal range of motion. Neck supple.  Cardiovascular: Normal rate.  Respiratory: Effort normal.  Neurological: She is alert and oriented to person, place, and time. No cranial nerve deficit.  Skin: Skin is warm and dry.  Psychiatric: She has a normal mood and affect. Her behavior is normal. Judgment and thought content normal.     Assessment/Plan Pharyngeal foreign body  I personally reviewed the neck x-ray demonstrating a bone foreign  body.  I recommended direct laryngoscopy with foreign body removal and cervical esophagoscopy.  Risks, benefits, and alternatives were discussed.  Melida Quitter, MD 01/14/2019, 10:54 PM

## 2019-01-14 NOTE — ED Provider Notes (Addendum)
[facility] EMERGENCY DEPARTMENT Provider Note   CSN: [id number] Arrival date & time: 01/14/19  2015    History   Chief Complaint Chief Complaint  Patient presents with   Foreign Body    HPI [name]. [name] is a 68 y.o. female.     Patient with history of anemia presents with foreign body in her throat.  Patient had chicken approximately 7:00 since then has had a bone stuck in the top of her throat.  No vomiting, mild spitting up, no shortness of breath.  No other symptoms currently.  Past Medical History:  Diagnosis Date   Anemia    with a pregnancy           Patient Active Problem List   Diagnosis Date Noted   Seborrheic dermatitis of scalp 12/22/2015   Travel advice encounter 05/21/2015   Well adult exam 06/20/2012   ACTINIC KERATOSIS 07/26/2007   KERATOSIS, SEBORRHEIC [name] 07/26/2007    Past Surgical History:  Procedure Laterality Date   APPENDECTOMY     CHOLECYSTECTOMY     COLONOSCOPY  08/23/2006   DB- normal    TONSILLECTOMY       OB History   No obstetric history on file.      Home Medications    Prior to Admission medications   Medication Sig Start Date End Date Taking? Authorizing Provider  calcium citrate-vitamin D (CITRACAL+D) 315-200 MG-UNIT tablet Take 1 tablet by mouth daily.   Yes [provider]  Cholecalciferol (VITAMIN D3) 2000 units capsule Take 1 capsule (2,000 Units total) by mouth daily. 01/10/17  Yes [name], [name], MD    Family History Family History  Problem Relation Age of Onset   Aneurysm Father        AAA   Cancer Brother 34       eye melanoma   Colon cancer Neg Hx    Esophageal cancer Neg Hx    Rectal cancer Neg Hx    Stomach cancer Neg Hx    Pancreatic cancer Neg Hx     Social History Social History   Tobacco Use   Smoking status: Never Smoker   Smokeless tobacco: Never Used  Substance Use Topics   Alcohol use: No    Alcohol/week: 0.0 standard drinks   Drug use: No      Allergies   Cefaclor   Review of Systems Review of Systems  Constitutional: Negative for chills and fever.  Respiratory: Negative for shortness of breath.   Cardiovascular: Negative for chest pain.  Gastrointestinal: Negative for abdominal pain and vomiting.  Musculoskeletal: Negative for back pain, neck pain and neck stiffness.  Skin: Negative for rash.  Neurological: Negative for light-headedness and headaches.     Physical Exam Updated Vital Signs BP (!) 148/74   Pulse 67   Temp 98.6 F (37 C) (Oral)   Resp 16   SpO2 98%   Physical Exam Vitals signs and nursing note reviewed.  Constitutional:      Appearance: She is well-developed.  HENT:     Head: Normocephalic and atraumatic.     Comments: Posterior pharynx normal-appearing, no foreign body visualized.  Patient has mild discomfort with palpation of midline anterior cervical region. Eyes:     General:        Right eye: No discharge.        Left eye: No discharge.  Neck:     Musculoskeletal: Normal range of motion and neck supple.     Trachea:  No tracheal deviation.  Cardiovascular:     Rate and Rhythm: Normal rate.  Pulmonary:     Effort: Pulmonary effort is normal.     Breath sounds: No stridor.  Skin:    General: Skin is warm.     Findings: No rash.  Neurological:     Mental Status: She is alert and oriented to person, place, and time.      ED Treatments / Results  Labs (all labs ordered are listed, but only abnormal results are displayed) Labs Reviewed - No data to display  EKG None  Radiology Dg Neck Soft Tissue  Result Date: 01/14/2019 CLINICAL DATA:  Pain after choking on a chicken bone. EXAM: NECK SOFT TISSUES - 1+ VIEW COMPARISON:  None. FINDINGS: Two view exam of the neck shows a linear apparent foreign body projecting over the hypopharynx on lateral film, superimposed on the epiglottis. As no calcified normal anatomy exists at this location in this does not appear to represent  vascular calcification, ingested chicken bone would be a distinct consideration. This cannot be definitely localized on the frontal projection. There is no gas in the prevertebral soft tissues and no prevertebral soft tissue swelling. IMPRESSION: 19 mm linear apparent foreign body in the hypopharynx appears calcified and is suspicious for a chicken bone. Results discussed with Dr. Reather Converse at the time of study interpretation. Electronically Signed   By: Misty Stanley M.D.   On: 01/14/2019 22:05    Procedures Procedures (including critical care time)  Medications Ordered in ED Medications  sodium chloride 0.9 % bolus 500 mL (has no administration in time range)     Initial Impression / Assessment and Plan / ED Course  I have reviewed the triage vital signs and the nursing notes.  Pertinent labs & imaging results that were available during my care of the patient were reviewed by me and considered in my medical decision making (see chart for details).       Patient presents with foreign body sensation in her throat since eating chicken.  X-ray reviewed showing chicken bone. Discussed with radiology followed by ENT.  Patient taken to the OR to have it removed.  Updated the patient on plan of care. N.p.o. and peripheral IV placed prior. Final Clinical Impressions(s) / ED Diagnoses   Final diagnoses:  Foreign body sensation in throat    ED Discharge Orders     None        [name], MD 01/14/19 4709    [name], MD 08/16/22 1414

## 2019-01-14 NOTE — ED Notes (Signed)
Call Jonni Sanger for updates and if discharged [phone]

## 2019-01-14 NOTE — ED Triage Notes (Signed)
Pt c/o chicken bone stuck in throat x 2 hours. Denies vomiting/shortness of breath.

## 2019-01-14 NOTE — Transfer of Care (Signed)
Immediate Anesthesia Transfer of Care Note  Patient: [name]. [name]  Procedure(s) Performed: DIRECT LARYNGOSCOPY (N/A ) Removal Foreign Body Esophageal  Patient Location: PACU  Anesthesia Type:General  Level of Consciousness: awake, alert  and oriented  Airway & Oxygen Therapy: Patient Spontanous Breathing  Post-op Assessment: Report given to RN and Post -op Vital signs reviewed and stable  Post vital signs: Reviewed and stable  Last Vitals:  Vitals Value Taken Time  BP 136/78 01/14/2019 11:39 PM  Temp    Pulse 76 01/14/2019 11:39 PM  Resp 12 01/14/2019 11:39 PM  SpO2 94 % 01/14/2019 11:39 PM  Vitals shown include unvalidated device data.  Last Pain:  Vitals:   01/14/19 2218  TempSrc:   PainSc: 0-No pain         Complications: No apparent anesthesia complications

## 2019-01-15 ENCOUNTER — Telehealth: Payer: Self-pay | Admitting: Internal Medicine

## 2019-01-15 ENCOUNTER — Encounter (HOSPITAL_COMMUNITY): Payer: Self-pay | Admitting: Otolaryngology

## 2019-01-15 NOTE — Telephone Encounter (Signed)
Patients spouse called [facility] 01/14/19 at 7:11pm stating that she had a piece of chicken bone stuck in her throat.  States she was not actively chocking but afraid to swallow.  She was advised to go to the ED.

## 2019-01-15 NOTE — Op Note (Signed)
NAME: [name], [name] MEDICAL RECORD [id number] ACCOUNT [id number] DATE OF BIRTH:[birth year] FACILITY: [facility] LOCATION: [facility] PHYSICIAN:Coady Train DRedmond Baseman, MD  OPERATIVE REPORT  DATE OF PROCEDURE:  01/14/2019  PREOPERATIVE DIAGNOSIS:  Pharyngeal foreign body.  POSTOPERATIVE DIAGNOSIS:  Pharyngeal foreign body.  PROCEDURE:   1.  Direct laryngoscopy with removal of foreign body. 2.  Cervical esophagoscopy.  SURGEON:  Melida Quitter, MD  ANESTHESIA:  General endotracheal anesthesia.  COMPLICATIONS:  None.  INDICATIONS:  The patient is a 68 year old female who was eating chicken for her dinner earlier today and swallowed a chicken bone that she still feels in place.  She came to the Emergency Department where an x-ray confirmed the chicken bone alongside  the larynx.  She has not had any airway symptoms and presents to the operating room for removal.  FINDINGS:  Upon laryngoscopy, the chicken bone came out easily.  Examination of the pharynx, larynx and proximal esophagus was unremarkable aside from a little bit of bleeding in the vallecula.  DESCRIPTION OF PROCEDURE:  The patient was identified in the holding room, informed consent having been obtained including discussion of risks, benefits and alternatives.  The patient was brought to the operative suite and put the operative suite and put  on the operative table in supine position.  The bed was turned 90 degrees from anesthesia and the patient was induced under mask anesthesia.  The eyes were taped closed and a MacIntosh laryngoscope was then used to evaluate the larynx.  The foreign body  was not immediately visualized.  A #7 endotracheal tube was then placed in through the vocal folds easily and the laryngoscope was removed and the foreign body came out with the laryngoscope at this point.  The tube was taped and a tooth guard was  placed over the upper teeth.  An anterior commissure laryngoscope was then inserted to view the  various areas of the pharynx and larynx including the vallecula, the endolarynx, the piriform sinuses and the postcricoid hypopharynx.  The laryngoscope was  then removed.  A cervical esophagoscope was then inserted and placed in through the pharynx to the proximal esophagus keeping the lumen in view down to the mid esophagus.  It was then backed out slowly examining the esophagus.  After this was completed,  the patient was returned to anesthesia for wakeup and tooth guard was removed.  She was extubated and taken to the recovery room in stable condition.  JN/NUANCE  D:01/14/2019 T:01/15/2019 JOB:[id number]

## 2019-01-15 NOTE — Telephone Encounter (Signed)
Patient went to ED

## 2019-01-15 NOTE — Telephone Encounter (Signed)
I agree . Thx.  

## 2019-01-18 DIAGNOSIS — R070 Pain in throat: Secondary | ICD-10-CM | POA: Insufficient documentation

## 2019-01-18 NOTE — Anesthesia Postprocedure Evaluation (Signed)
Anesthesia Post Note  Patient: [name]. [name]  Procedure(s) Performed: DIRECT LARYNGOSCOPY (N/A ) Removal Foreign Body Esophageal     Patient location during evaluation: PACU Anesthesia Type: General Level of consciousness: awake and alert Pain management: pain level controlled Vital Signs Assessment: post-procedure vital signs reviewed and stable Respiratory status: spontaneous breathing, nonlabored ventilation, respiratory function stable and patient connected to nasal cannula oxygen Cardiovascular status: blood pressure returned to baseline and stable Postop Assessment: no apparent nausea or vomiting Anesthetic complications: no    Last Vitals:  Vitals:   01/14/19 2339 01/14/19 2354  BP: 136/78 136/87  Pulse: 77 73  Resp: 12 14  Temp: 36.5 C 36.5 C  SpO2: 94% 96%    Last Pain:  Vitals:   01/14/19 2354  TempSrc:   PainSc: 0-No pain                 [name]

## 2019-06-26 ENCOUNTER — Encounter: Payer: Self-pay | Admitting: Internal Medicine

## 2019-06-26 ENCOUNTER — Other Ambulatory Visit (INDEPENDENT_AMBULATORY_CARE_PROVIDER_SITE_OTHER): Payer: Medicare Other

## 2019-06-26 ENCOUNTER — Other Ambulatory Visit: Payer: Self-pay

## 2019-06-26 ENCOUNTER — Ambulatory Visit (INDEPENDENT_AMBULATORY_CARE_PROVIDER_SITE_OTHER): Payer: Medicare Other | Admitting: Internal Medicine

## 2019-06-26 ENCOUNTER — Ambulatory Visit (INDEPENDENT_AMBULATORY_CARE_PROVIDER_SITE_OTHER)
Admission: RE | Admit: 2019-06-26 | Discharge: 2019-06-26 | Disposition: A | Discharge status: Home / Self Care | Payer: Medicare Other | Source: Ambulatory Visit | Attending: Internal Medicine | Admitting: Internal Medicine

## 2019-06-26 VITALS — BP 162/80 | HR 74 | Temp 98.3°F | Ht 64.0 in | Wt 156.0 lb

## 2019-06-26 DIAGNOSIS — Z780 Asymptomatic menopausal state: Secondary | ICD-10-CM

## 2019-06-26 DIAGNOSIS — Z23 Encounter for immunization: Secondary | ICD-10-CM

## 2019-06-26 DIAGNOSIS — Z0000 Encounter for general adult medical examination without abnormal findings: Secondary | ICD-10-CM

## 2019-06-26 LAB — LIPID PANEL
Cholesterol: 154 mg/dL (ref 0–200)
HDL: 65.9 mg/dL (ref >39.00)
LDL Cholesterol: 71 mg/dL (ref 0–99)
NonHDL: 88.53
Total CHOL/HDL Ratio: 2
Triglycerides: 89 mg/dL (ref 0.0–149.0)
VLDL: 17.8 mg/dL (ref 0.0–40.0)

## 2019-06-26 LAB — BASIC METABOLIC PANEL
BUN: 15 mg/dL (ref 6–23)
CO2: 25 mEq/L (ref 19–32)
Calcium: 10.1 mg/dL (ref 8.4–10.5)
Chloride: 103 mEq/L (ref 96–112)
Creatinine, Ser: 0.79 mg/dL (ref 0.40–1.20)
GFR: 72.61 mL/min (ref >60.00)
Glucose, Bld: 97 mg/dL (ref 70–99)
Potassium: 4 mEq/L (ref 3.5–5.1)
Sodium: 140 mEq/L (ref 135–145)

## 2019-06-26 LAB — CBC WITH DIFFERENTIAL/PLATELET
Basophils Absolute: 0 10*3/uL (ref 0.0–0.1)
Basophils Relative: 0.7 % (ref 0.0–3.0)
Eosinophils Absolute: 0.3 10*3/uL (ref 0.0–0.7)
Eosinophils Relative: 3.7 % (ref 0.0–5.0)
HCT: 41.6 % (ref 36.0–46.0)
Hemoglobin: 13.8 g/dL (ref 12.0–15.0)
Lymphocytes Relative: 34.8 % (ref 12.0–46.0)
Lymphs Abs: 2.3 10*3/uL (ref 0.7–4.0)
MCHC: 33.2 g/dL (ref 30.0–36.0)
MCV: 89.9 fl (ref 78.0–100.0)
Monocytes Absolute: 0.5 10*3/uL (ref 0.1–1.0)
Monocytes Relative: 7.4 % (ref 3.0–12.0)
Neutro Abs: 3.6 10*3/uL (ref 1.4–7.7)
Neutrophils Relative %: 53.4 % (ref 43.0–77.0)
Platelets: 286 10*3/uL (ref 150.0–400.0)
RBC: 4.62 Mil/uL (ref 3.87–5.11)
RDW: 12.6 % (ref 11.5–15.5)
WBC: 6.7 10*3/uL (ref 4.0–10.5)

## 2019-06-26 LAB — HEPATIC FUNCTION PANEL
ALT: 15 U/L (ref 0–35)
AST: 16 U/L (ref 0–37)
Albumin: 4.6 g/dL (ref 3.5–5.2)
Alkaline Phosphatase: 69 U/L (ref 39–117)
Bilirubin, Direct: 0 mg/dL (ref 0.0–0.3)
Total Bilirubin: 0.4 mg/dL (ref 0.2–1.2)
Total Protein: 7.6 g/dL (ref 6.0–8.3)

## 2019-06-26 LAB — URINALYSIS, ROUTINE W REFLEX MICROSCOPIC
Bilirubin Urine: NEGATIVE
Ketones, ur: NEGATIVE
Nitrite: NEGATIVE
Specific Gravity, Urine: 1.015 (ref 1.000–1.030)
Total Protein, Urine: NEGATIVE
Urine Glucose: NEGATIVE
Urobilinogen, UA: 0.2 (ref 0.0–1.0)
pH: 6 (ref 5.0–8.0)

## 2019-06-26 LAB — TSH: TSH: 3.56 u[IU]/mL (ref 0.35–4.50)

## 2019-06-26 NOTE — Assessment & Plan Note (Signed)
We discussed age appropriate health related issues, including available/recomended screening tests and vaccinations. We discussed a need for adhering to healthy diet and exercise. Labs were ordered to be later reviewed . All questions were answered. GYN/mammo q 12 mo Colon 2018 Prevnar Meningiococcal vaccine for travel Shingrix Rx

## 2019-06-26 NOTE — Progress Notes (Signed)
Subjective:  Patient ID: [name]. Eldred, female    DOB: 10-07-1950  Age: 68 y.o. MRN: [id number]  CC: No chief complaint on file.   HPI [name] presents for a well exam  Outpatient Medications Prior to Visit  Medication Sig Dispense Refill  . calcium citrate-vitamin D (CITRACAL+D) 315-200 MG-UNIT tablet Take 1 tablet by mouth daily.    . Cholecalciferol (VITAMIN D3) 2000 units capsule Take 1 capsule (2,000 Units total) by mouth daily. 100 capsule 3   Facility-Administered Medications Prior to Visit  Medication Dose Route Frequency Provider Last Rate Last Dose  . 0.9 %  sodium chloride infusion  500 mL Intravenous Continuous Nandigam, Kavitha V, MD        ROS: Review of Systems  Constitutional: Negative for activity change, appetite change, chills, fatigue and unexpected weight change.  HENT: Negative for congestion, mouth sores and sinus pressure.   Eyes: Negative for visual disturbance.  Respiratory: Negative for cough and chest tightness.   Gastrointestinal: Negative for abdominal pain and nausea.  Genitourinary: Negative for difficulty urinating, frequency and vaginal pain.  Musculoskeletal: Negative for back pain and gait problem.  Skin: Negative for pallor and rash.  Neurological: Negative for dizziness, tremors, weakness, numbness and headaches.  Psychiatric/Behavioral: Negative for confusion, sleep disturbance and suicidal ideas.    Objective:  BP (!) 162/80 (BP Location: Left Arm, Patient Position: Sitting, Cuff Size: Normal)   Pulse 74   Temp 98.3 F (36.8 C) (Oral)   Ht 5\' 4"  (1.626 m)   Wt 156 lb (70.8 kg)   SpO2 94%   BMI 26.78 kg/m   BP Readings from Last 3 Encounters:  06/26/19 (!) 162/80  01/14/19 136/87  01/17/18 126/82    Wt Readings from Last 3 Encounters:  06/26/19 156 lb (70.8 kg)  01/17/18 152 lb (68.9 kg)  03/09/17 143 lb (64.9 kg)    Physical Exam Constitutional:      General: She is not in acute distress.  Appearance: She is well-developed.  HENT:     Head: Normocephalic.     Right Ear: External ear normal.     Left Ear: External ear normal.     Nose: Nose normal.  Eyes:     General:        Right eye: No discharge.        Left eye: No discharge.     Conjunctiva/sclera: Conjunctivae normal.     Pupils: Pupils are equal, round, and reactive to light.  Neck:     Musculoskeletal: Normal range of motion and neck supple.     Thyroid: No thyromegaly.     Vascular: No JVD.     Trachea: No tracheal deviation.  Cardiovascular:     Rate and Rhythm: Normal rate and regular rhythm.     Heart sounds: Normal heart sounds.  Pulmonary:     Effort: No respiratory distress.     Breath sounds: No stridor. No wheezing.  Abdominal:     General: Bowel sounds are normal. There is no distension.     Palpations: Abdomen is soft. There is no mass.     Tenderness: There is no abdominal tenderness. There is no guarding or rebound.  Musculoskeletal:        General: No tenderness.  Lymphadenopathy:     Cervical: No cervical adenopathy.  Skin:    Findings: No erythema or rash.  Neurological:     Cranial Nerves: No cranial nerve deficit.     Motor: No  abnormal muscle tone.     Coordination: Coordination normal.     Deep Tendon Reflexes: Reflexes normal.  Psychiatric:        Behavior: Behavior normal.        Thought Content: Thought content normal.        Judgment: Judgment normal.     Lab Results  Component Value Date   WBC 6.7 01/17/2018   HGB 14.3 01/17/2018   HCT 42.8 01/17/2018   PLT 297.0 01/17/2018   GLUCOSE 101 (H) 01/17/2018   CHOL 192 01/17/2018   TRIG 70.0 01/17/2018   HDL 69.50 01/17/2018   LDLDIRECT 139.0 04/07/2009   LDLCALC 108 (H) 01/17/2018   ALT 13 01/17/2018   ALT 13 01/17/2018   AST 16 01/17/2018   AST 16 01/17/2018   NA 138 01/17/2018   K 4.0 01/17/2018   CL 101 01/17/2018   CREATININE 0.81 01/17/2018   BUN 13 01/17/2018   CO2 29 01/17/2018   TSH 2.12 01/17/2018     Dg Neck Soft Tissue  Result Date: 01/14/2019 CLINICAL DATA:  Pain after choking on a chicken bone. EXAM: NECK SOFT TISSUES - 1+ VIEW COMPARISON:  None. FINDINGS: Two view exam of the neck shows a linear apparent foreign body projecting over the hypopharynx on lateral film, superimposed on the epiglottis. As no calcified normal anatomy exists at this location in this does not appear to represent vascular calcification, ingested chicken bone would be a distinct consideration. This cannot be definitely localized on the frontal projection. There is no gas in the prevertebral soft tissues and no prevertebral soft tissue swelling. IMPRESSION: 19 mm linear apparent foreign body in the hypopharynx appears calcified and is suspicious for a chicken bone. Results discussed with Dr. Reather Converse at the time of study interpretation. Electronically Signed   By: Misty Stanley M.D.   On: 01/14/2019 22:05    Assessment & Plan:   Diagnoses and all orders for this visit:  Need for influenza vaccination -     Flu Vaccine QUAD High Dose(Fluad)     No orders of the defined types were placed in this encounter.    Follow-up: No follow-ups on file.  Walker Kehr, MD

## 2019-06-26 NOTE — Patient Instructions (Signed)
Cardiac CT calcium scoring test $150   Computed tomography, more commonly known as a CT or CAT scan, is a diagnostic medical imaging test. Like traditional x-rays, it produces multiple images or pictures of the inside of the body. The cross-sectional images generated during a CT scan can be reformatted in multiple planes. They can even generate three-dimensional images. These images can be viewed on a computer monitor, printed on film or by a 3D printer, or transferred to a CD or DVD. CT images of internal organs, bones, soft tissue and blood vessels provide greater detail than traditional x-rays, particularly of soft tissues and blood vessels. A cardiac CT scan for coronary calcium is a non-invasive way of obtaining information about the presence, location and extent of calcified plaque in the coronary arteries-the vessels that supply oxygen-containing blood to the heart muscle. Calcified plaque results when there is a build-up of fat and other substances under the inner layer of the artery. This material can calcify which signals the presence of atherosclerosis, a disease of the vessel wall, also called coronary artery disease (CAD). People with this disease have an increased risk for heart attacks. In addition, over time, progression of plaque build up (CAD) can narrow the arteries or even close off blood flow to the heart. The result may be chest pain, sometimes called "angina," or a heart attack. Because calcium is a marker of CAD, the amount of calcium detected on a cardiac CT scan is a helpful prognostic tool. The findings on cardiac CT are expressed as a calcium score. Another name for this test is coronary artery calcium scoring.  What are some common uses of the procedure? The goal of cardiac CT scan for calcium scoring is to determine if CAD is present and to what extent, even if there are no symptoms. It is a screening study that may be recommended by a physician for patients with risk factors  for CAD but no clinical symptoms. The major risk factors for CAD are: . high blood cholesterol levels  . family history of heart attacks  . diabetes  . high blood pressure  . cigarette smoking  . overweight or obese  . physical inactivity   A negative cardiac CT scan for calcium scoring shows no calcification within the coronary arteries. This suggests that CAD is absent or so minimal it cannot be seen by this technique. The chance of having a heart attack over the next two to five years is very low under these circumstances. A positive test means that CAD is present, regardless of whether or not the patient is experiencing any symptoms. The amount of calcification-expressed as the calcium score-may help to predict the likelihood of a myocardial infarction (heart attack) in the coming years and helps your medical doctor or cardiologist decide whether the patient may need to take preventive medicine or undertake other measures such as diet and exercise to lower the risk for heart attack. The extent of CAD is graded according to your calcium score:  Calcium Score  Presence of CAD (coronary artery disease)  0 No evidence of CAD   1-10 Minimal evidence of CAD  11-100 Mild evidence of CAD  101-400 Moderate evidence of CAD  Over 400 Extensive evidence of CAD    

## 2019-07-25 ENCOUNTER — Ambulatory Visit (INDEPENDENT_AMBULATORY_CARE_PROVIDER_SITE_OTHER)
Admission: RE | Admit: 2019-07-25 | Discharge: 2019-07-25 | Disposition: A | Discharge status: Home / Self Care | Payer: Medicare Other | Source: Ambulatory Visit | Attending: Internal Medicine | Admitting: Internal Medicine

## 2019-07-25 ENCOUNTER — Other Ambulatory Visit: Payer: Self-pay

## 2019-07-25 DIAGNOSIS — Z0000 Encounter for general adult medical examination without abnormal findings: Secondary | ICD-10-CM

## 2019-08-01 DIAGNOSIS — Z1231 Encounter for screening mammogram for malignant neoplasm of breast: Secondary | ICD-10-CM | POA: Diagnosis not present

## 2019-08-01 LAB — HM MAMMOGRAPHY

## 2019-09-25 ENCOUNTER — Encounter: Payer: Self-pay | Admitting: Internal Medicine

## 2019-11-23 ENCOUNTER — Ambulatory Visit: Payer: Medicare Other | Attending: Internal Medicine

## 2019-11-23 DIAGNOSIS — Z23 Encounter for immunization: Secondary | ICD-10-CM | POA: Insufficient documentation

## 2019-11-23 NOTE — Progress Notes (Signed)
   Covid-19 Vaccination Clinic  Name:  [name]    MRN: [id number] DOB: 1950-10-22  11/23/2019  [name] was observed post Covid-19 immunization for 15 minutes without incidence. She was provided with Vaccine Information Sheet and instruction to access the V-Safe system.   [name] was instructed to call 911 with any severe reactions post vaccine: Marland Kitchen Difficulty breathing  . Swelling of your face and throat  . A fast heartbeat  . A bad rash all over your body  . Dizziness and weakness    Immunizations Administered    Name Date Dose VIS Date Route   Pfizer COVID-19 Vaccine 11/23/2019  8:16 AM 0.3 mL 09/07/2019 Intramuscular   Manufacturer: [place]   Lot: [id number]   [place]: [id number]

## 2019-12-18 ENCOUNTER — Ambulatory Visit: Payer: Medicare Other | Attending: Internal Medicine

## 2019-12-18 DIAGNOSIS — Z23 Encounter for immunization: Secondary | ICD-10-CM

## 2019-12-18 NOTE — Progress Notes (Signed)
   Covid-19 Vaccination Clinic  Name:  Lakshya Chhoeun. [facility]    MRN: [id number] DOB: [birth year]  12/18/2019  Ms. Larkey was observed post Covid-19 immunization for 15 minutes without incident. She was provided with Vaccine Information Sheet and instruction to access the V-Safe system.   Ms. Witzke was instructed to call 911 with any severe reactions post vaccine: Marland Kitchen Difficulty breathing  . Swelling of face and throat  . A fast heartbeat  . A bad rash all over body  . Dizziness and weakness   Immunizations Administered    Name Date Dose VIS Date Route   Pfizer COVID-19 Vaccine 12/18/2019  9:16 AM 0.3 mL 09/07/2019 Intramuscular   Manufacturer: [place]   Lot: [id number]   [place]: [id number]

## 2020-05-02 ENCOUNTER — Encounter: Payer: Self-pay | Admitting: Gastroenterology

## 2020-06-13 ENCOUNTER — Telehealth: Payer: Self-pay | Admitting: Internal Medicine

## 2020-06-13 ENCOUNTER — Telehealth: Payer: Self-pay | Admitting: Gastroenterology

## 2020-06-13 DIAGNOSIS — Z0000 Encounter for general adult medical examination without abnormal findings: Secondary | ICD-10-CM

## 2020-06-13 NOTE — Telephone Encounter (Signed)
   Patient requesting order for annual labs prior to 9/29 appointment

## 2020-06-16 NOTE — Telephone Encounter (Signed)
No answer.Voicemail is full. °

## 2020-06-17 ENCOUNTER — Encounter: Payer: Medicare Other | Admitting: Internal Medicine

## 2020-06-19 ENCOUNTER — Other Ambulatory Visit: Payer: Self-pay | Admitting: Gastroenterology

## 2020-06-19 ENCOUNTER — Other Ambulatory Visit: Payer: Self-pay

## 2020-06-19 ENCOUNTER — Ambulatory Visit (AMBULATORY_SURGERY_CENTER): Payer: Self-pay | Admitting: *Deleted

## 2020-06-19 VITALS — Ht 62.5 in | Wt 171.8 lb

## 2020-06-19 DIAGNOSIS — Z86010 Personal history of colonic polyps: Secondary | ICD-10-CM

## 2020-06-19 MED ORDER — PLENVU 140 G PO SOLR: 1.0000 | ORAL | 0 refills | Status: DC

## 2020-06-19 NOTE — Progress Notes (Signed)
Patient denies any allergies to egg or soy products. Patient denies complications with anesthesia/sedation.  Patient denies oxygen use at home and denies diet medications. Emmi instructions for colonoscopy explained and sent via MyChart.

## 2020-06-20 ENCOUNTER — Telehealth: Payer: Self-pay | Admitting: Gastroenterology

## 2020-06-20 ENCOUNTER — Encounter: Payer: Self-pay | Admitting: Gastroenterology

## 2020-06-20 NOTE — Telephone Encounter (Signed)
Okay.  Thanks.

## 2020-06-20 NOTE — Telephone Encounter (Signed)
Pt informed of labs

## 2020-06-20 NOTE — Telephone Encounter (Signed)
Spoke with the patient. She will pay the $60 for the Plenvu prep. New coupon given to pharmacy over the phone-pt is aware.

## 2020-06-23 ENCOUNTER — Other Ambulatory Visit (INDEPENDENT_AMBULATORY_CARE_PROVIDER_SITE_OTHER): Payer: Medicare Other

## 2020-06-23 DIAGNOSIS — Z136 Encounter for screening for cardiovascular disorders: Secondary | ICD-10-CM | POA: Diagnosis not present

## 2020-06-23 DIAGNOSIS — Z0000 Encounter for general adult medical examination without abnormal findings: Secondary | ICD-10-CM | POA: Diagnosis not present

## 2020-06-23 LAB — COMPREHENSIVE METABOLIC PANEL
ALT: 29 U/L (ref 0–35)
AST: 62 U/L — ABNORMAL HIGH (ref 0–37)
Albumin: 4.4 g/dL (ref 3.5–5.2)
Alkaline Phosphatase: 68 U/L (ref 39–117)
BUN: 17 mg/dL (ref 6–23)
CO2: 24 mEq/L (ref 19–32)
Calcium: 9.3 mg/dL (ref 8.4–10.5)
Chloride: 102 mEq/L (ref 96–112)
Creatinine, Ser: 1 mg/dL (ref 0.40–1.20)
GFR: 55.15 mL/min — ABNORMAL LOW (ref >60.00)
Glucose, Bld: 83 mg/dL (ref 70–99)
Potassium: 3.8 mEq/L (ref 3.5–5.1)
Sodium: 139 mEq/L (ref 135–145)
Total Bilirubin: 0.6 mg/dL (ref 0.2–1.2)
Total Protein: 7.4 g/dL (ref 6.0–8.3)

## 2020-06-23 LAB — CBC WITH DIFFERENTIAL/PLATELET
Basophils Absolute: 0.1 10*3/uL (ref 0.0–0.1)
Basophils Relative: 0.8 % (ref 0.0–3.0)
Eosinophils Absolute: 0.1 10*3/uL (ref 0.0–0.7)
Eosinophils Relative: 1.7 % (ref 0.0–5.0)
HCT: 39.4 % (ref 36.0–46.0)
Hemoglobin: 12.9 g/dL (ref 12.0–15.0)
Lymphocytes Relative: 25.7 % (ref 12.0–46.0)
Lymphs Abs: 2 10*3/uL (ref 0.7–4.0)
MCHC: 32.8 g/dL (ref 30.0–36.0)
MCV: 90.2 fl (ref 78.0–100.0)
Monocytes Absolute: 0.6 10*3/uL (ref 0.1–1.0)
Monocytes Relative: 7.8 % (ref 3.0–12.0)
Neutro Abs: 4.9 10*3/uL (ref 1.4–7.7)
Neutrophils Relative %: 64 % (ref 43.0–77.0)
Platelets: 287 10*3/uL (ref 150.0–400.0)
RBC: 4.37 Mil/uL (ref 3.87–5.11)
RDW: 13 % (ref 11.5–15.5)
WBC: 7.7 10*3/uL (ref 4.0–10.5)

## 2020-06-23 LAB — URINALYSIS, ROUTINE W REFLEX MICROSCOPIC
Ketones, ur: 40 — AB
Nitrite: NEGATIVE
Specific Gravity, Urine: 1.02 (ref 1.000–1.030)
Total Protein, Urine: NEGATIVE
Urine Glucose: NEGATIVE
Urobilinogen, UA: 0.2 (ref 0.0–1.0)
pH: 6 (ref 5.0–8.0)

## 2020-06-23 LAB — LIPID PANEL
Cholesterol: 165 mg/dL (ref 0–200)
HDL: 58.9 mg/dL (ref >39.00)
LDL Cholesterol: 92 mg/dL (ref 0–99)
NonHDL: 106.07
Total CHOL/HDL Ratio: 3
Triglycerides: 70 mg/dL (ref 0.0–149.0)
VLDL: 14 mg/dL (ref 0.0–40.0)

## 2020-06-23 LAB — TSH: TSH: 4.71 u[IU]/mL — ABNORMAL HIGH (ref 0.35–4.50)

## 2020-06-23 NOTE — Addendum Note (Signed)
Addended by: Boris Lown B on: 06/23/2020 09:52 AM   Modules accepted: Orders

## 2020-06-25 ENCOUNTER — Ambulatory Visit (INDEPENDENT_AMBULATORY_CARE_PROVIDER_SITE_OTHER): Payer: Medicare Other | Admitting: Internal Medicine

## 2020-06-25 ENCOUNTER — Encounter: Payer: Self-pay | Admitting: Internal Medicine

## 2020-06-25 ENCOUNTER — Other Ambulatory Visit: Payer: Self-pay

## 2020-06-25 VITALS — BP 140/82 | HR 67 | Temp 98.2°F | Ht 62.5 in | Wt 169.0 lb

## 2020-06-25 DIAGNOSIS — Z7184 Encounter for health counseling related to travel: Secondary | ICD-10-CM

## 2020-06-25 DIAGNOSIS — Z0000 Encounter for general adult medical examination without abnormal findings: Secondary | ICD-10-CM

## 2020-06-25 DIAGNOSIS — R7989 Other specified abnormal findings of blood chemistry: Secondary | ICD-10-CM

## 2020-06-25 DIAGNOSIS — Z23 Encounter for immunization: Secondary | ICD-10-CM

## 2020-06-25 MED ORDER — ATOVAQUONE-PROGUANIL HCL 250-100 MG PO TABS: ORAL_TABLET | ORAL | 1 refills | Status: DC

## 2020-06-25 MED ORDER — FLUCONAZOLE 150 MG PO TABS: 150.0000 mg | ORAL_TABLET | Freq: Once | ORAL | 1 refills | Status: AC

## 2020-06-25 MED ORDER — CIPROFLOXACIN HCL 500 MG PO TABS
ORAL_TABLET | ORAL | 0 refills | Status: DC
Start: 2020-06-25 — End: 2020-12-31

## 2020-06-25 MED ORDER — FLUCONAZOLE 150 MG PO TABS: 150.0000 mg | ORAL_TABLET | Freq: Once | ORAL | 1 refills | Status: DC

## 2020-06-25 NOTE — Progress Notes (Signed)
Subjective:  Patient ID: [name], female    DOB: Dec 15, 1950  Age: 69 y.o. MRN: [id number]  CC: Annual Exam   HPI [name] presents for a well exam   Outpatient Medications Prior to Visit  Medication Sig Dispense Refill  . Cholecalciferol (VITAMIN D3) 2000 units capsule Take 1 capsule (2,000 Units total) by mouth daily. 100 capsule 3  . PEG-KCl-NaCl-NaSulf-Na Asc-C (PLENVU) 140 g SOLR Take 1 kit by mouth as directed. 1 each 0   Facility-Administered Medications Prior to Visit  Medication Dose Route Frequency Provider Last Rate Last Admin  . 0.9 %  sodium chloride infusion  500 mL Intravenous Continuous Nandigam, Kavitha V, MD        ROS: Review of Systems  Constitutional: Negative for activity change, appetite change, chills, fatigue and unexpected weight change.  HENT: Negative for congestion, mouth sores and sinus pressure.   Eyes: Negative for visual disturbance.  Respiratory: Negative for cough and chest tightness.   Gastrointestinal: Negative for abdominal pain and nausea.  Genitourinary: Negative for difficulty urinating, frequency and vaginal pain.  Musculoskeletal: Negative for back pain and gait problem.  Skin: Negative for pallor and rash.  Neurological: Negative for dizziness, tremors, weakness, numbness and headaches.  Psychiatric/Behavioral: Negative for confusion and sleep disturbance.    Objective:  BP 140/82 (BP Location: Left Arm, Patient Position: Sitting, Cuff Size: Normal)   Pulse 67   Temp 98.2 F (36.8 C) (Oral)   Ht 5' 2.5" (1.588 m)   Wt 169 lb (76.7 kg)   LMP  (LMP Unknown)   SpO2 98%   BMI 30.42 kg/m   BP Readings from Last 3 Encounters:  06/25/20 140/82  06/26/19 (!) 162/80  01/14/19 136/87    Wt Readings from Last 3 Encounters:  06/25/20 169 lb (76.7 kg)  06/19/20 171 lb 12.8 oz (77.9 kg)  06/26/19 156 lb (70.8 kg)    Physical Exam Constitutional:      General: She is not in acute distress.    Appearance:  She is well-developed.  HENT:     Head: Normocephalic.     Right Ear: External ear normal.     Left Ear: External ear normal.     Nose: Nose normal.  Eyes:     General:        Right eye: No discharge.        Left eye: No discharge.     Conjunctiva/sclera: Conjunctivae normal.     Pupils: Pupils are equal, round, and reactive to light.  Neck:     Thyroid: No thyromegaly.     Vascular: No JVD.     Trachea: No tracheal deviation.  Cardiovascular:     Rate and Rhythm: Normal rate and regular rhythm.     Heart sounds: Normal heart sounds.  Pulmonary:     Effort: No respiratory distress.     Breath sounds: No stridor. No wheezing.  Abdominal:     General: Bowel sounds are normal. There is no distension.     Palpations: Abdomen is soft. There is no mass.     Tenderness: There is no abdominal tenderness. There is no guarding or rebound.  Musculoskeletal:        General: No tenderness.     Cervical back: Normal range of motion and neck supple.  Lymphadenopathy:     Cervical: No cervical adenopathy.  Skin:    Findings: No erythema or rash.  Neurological:     Cranial Nerves: No cranial  nerve deficit.     Motor: No abnormal muscle tone.     Coordination: Coordination normal.     Deep Tendon Reflexes: Reflexes normal.  Psychiatric:        Behavior: Behavior normal.        Thought Content: Thought content normal.        Judgment: Judgment normal.     Lab Results  Component Value Date   WBC 7.7 06/23/2020   HGB 12.9 06/23/2020   HCT 39.4 06/23/2020   PLT 287.0 06/23/2020   GLUCOSE 83 06/23/2020   CHOL 165 06/23/2020   TRIG 70.0 06/23/2020   HDL 58.90 06/23/2020   LDLDIRECT 139.0 04/07/2009   LDLCALC 92 06/23/2020   ALT 29 06/23/2020   AST 62 (H) 06/23/2020   NA 139 06/23/2020   K 3.8 06/23/2020   CL 102 06/23/2020   CREATININE 1.00 06/23/2020   BUN 17 06/23/2020   CO2 24 06/23/2020   TSH 4.71 (H) 06/23/2020    CT CARDIAC SCORING  Addendum Date: 07/26/2019     ADDENDUM REPORT: 07/26/2019 08:25 CLINICAL DATA:  Risk stratification EXAM: Coronary Calcium Score TECHNIQUE: The patient was scanned on a [employer] scanner. Axial non-contrast 3 mm slices were carried out through the heart. The data set was analyzed on a dedicated work station and scored using the [place]. FINDINGS: Non-cardiac: See separate report from [facility]. Ascending Aorta: Normal size. Pericardium: Normal. Coronary arteries: Normal origin. IMPRESSION: Coronary calcium score of 0. This was 0 percentile for age and sex matched control. Electronically Signed   By: Ena Dawley   On: 07/26/2019 08:25   Addendum Date: 07/25/2019   ADDENDUM REPORT: 07/25/2019 17:50 CLINICAL DATA:  Risk stratification EXAM: Coronary Calcium Score TECHNIQUE: The patient was scanned on a Siemens Somatom 64 slice scanner. Axial non-contrast 3 mm slices were carried out through the heart. The data set was analyzed on a dedicated work station and scored using the [place]. FINDINGS: Non-cardiac: See separate report from [facility]. Ascending aorta: Normal size Pericardium: Normal Coronary arteries: Normal origin IMPRESSION: Coronary calcium score of 0. This was 0 percentile for age and sex matched control. Kirk Ruths Electronically Signed   By: Kirk Ruths M.D.   On: 07/25/2019 17:50   Result Date: 07/26/2019 EXAM: OVER-READ INTERPRETATION  CT CHEST The following report is an over-read performed by radiologist Dr. Rolm Baptise of [facility], [place] on 07/25/2019. This over-read does not include interpretation of cardiac or coronary anatomy or pathology. The coronary calcium score interpretation by the cardiologist is attached. COMPARISON:  None. FINDINGS: Vascular: Heart is normal size.  Visualized aorta normal caliber. Mediastinum/Nodes: No adenopathy in the lower mediastinum or hila. Lungs/Pleura: Visualized lungs clear.  No effusions. Upper Abdomen: Imaging into the  upper abdomen shows no acute findings. Musculoskeletal: Chest wall soft tissues are unremarkable. No acute bony abnormality. IMPRESSION: No acute or significant extracardiac abnormality. Electronically Signed: By: Rolm Baptise M.D. On: 07/25/2019 16:46    Assessment & Plan:    Follow-up: No follow-ups on file.  Walker Kehr, MD

## 2020-06-25 NOTE — Assessment & Plan Note (Addendum)
We discussed age appropriate health related issues, including available/recomended screening tests and vaccinations. We discussed a need for adhering to healthy diet and exercise. Labs were ordered to be later reviewed . All questions were answered. GYN/mammo q 12 mo Colon 2018 Prevnar Coronary calcium score of 0. This was 0 percentile for age and sex matched control. Meningiococcal vaccine for travel Shingrix Rx

## 2020-06-25 NOTE — Assessment & Plan Note (Signed)
Labs

## 2020-06-25 NOTE — Addendum Note (Signed)
Addended by: Marcina Millard on: 06/25/2020 05:02 PM   Modules accepted: Orders

## 2020-06-25 NOTE — Addendum Note (Signed)
Addended by: Cassandria Anger on: 06/25/2020 03:49 PM   Modules accepted: Orders

## 2020-06-25 NOTE — Assessment & Plan Note (Signed)
Meds renewed.

## 2020-07-04 ENCOUNTER — Encounter: Payer: Medicare Other | Admitting: Gastroenterology

## 2020-07-18 ENCOUNTER — Other Ambulatory Visit (INDEPENDENT_AMBULATORY_CARE_PROVIDER_SITE_OTHER): Payer: Medicare Other

## 2020-07-18 ENCOUNTER — Telehealth: Payer: Self-pay

## 2020-07-18 DIAGNOSIS — R8290 Unspecified abnormal findings in urine: Secondary | ICD-10-CM | POA: Diagnosis not present

## 2020-07-18 DIAGNOSIS — R7989 Other specified abnormal findings of blood chemistry: Secondary | ICD-10-CM | POA: Diagnosis not present

## 2020-07-18 LAB — T4, FREE: Free T4: 0.95 ng/dL (ref 0.60–1.60)

## 2020-07-18 LAB — COMPREHENSIVE METABOLIC PANEL
ALT: 14 U/L (ref 0–35)
AST: 19 U/L (ref 0–37)
Albumin: 4.4 g/dL (ref 3.5–5.2)
Alkaline Phosphatase: 73 U/L (ref 39–117)
BUN: 13 mg/dL (ref 6–23)
CO2: 30 mEq/L (ref 19–32)
Calcium: 9.4 mg/dL (ref 8.4–10.5)
Chloride: 100 mEq/L (ref 96–112)
Creatinine, Ser: 0.75 mg/dL (ref 0.40–1.20)
GFR: 82.05 mL/min (ref >60.00)
Glucose, Bld: 92 mg/dL (ref 70–99)
Potassium: 4.3 mEq/L (ref 3.5–5.1)
Sodium: 137 mEq/L (ref 135–145)
Total Bilirubin: 0.5 mg/dL (ref 0.2–1.2)
Total Protein: 7.2 g/dL (ref 6.0–8.3)

## 2020-07-18 LAB — URINALYSIS
Bilirubin Urine: NEGATIVE
Hgb urine dipstick: NEGATIVE
Ketones, ur: NEGATIVE
Leukocytes,Ua: NEGATIVE
Nitrite: NEGATIVE
Specific Gravity, Urine: 1.015 (ref 1.000–1.030)
Total Protein, Urine: NEGATIVE
Urine Glucose: NEGATIVE
Urobilinogen, UA: 0.2 (ref 0.0–1.0)
pH: 6.5 (ref 5.0–8.0)

## 2020-07-18 LAB — TSH: TSH: 2.18 u[IU]/mL (ref 0.35–4.50)

## 2020-07-18 NOTE — Telephone Encounter (Signed)
she just finished her blood draw and wanted to see if Plot can put an urine order in. She says that she is having cloudy urine. itchy and discharged. She said that she have seen some clumps of some sort, but not sure of what and it has her concerned. Can we get an urine/ culture order put in?

## 2020-07-18 NOTE — Addendum Note (Signed)
Addended by: Trenda Moots on: 17/40/9927 03:27 PM   Modules accepted: Orders

## 2020-07-18 NOTE — Addendum Note (Signed)
Addended by: Trenda Moots on: 62/22/9798 05:01 PM   Modules accepted: Orders

## 2020-07-18 NOTE — Addendum Note (Signed)
Addended by: Elza Rafter D on: 07/18/2020 05:00 PM   Modules accepted: Orders

## 2020-07-18 NOTE — Telephone Encounter (Signed)
Verbal orders rec'd.  Order placed.

## 2020-07-25 ENCOUNTER — Telehealth: Payer: Self-pay | Admitting: Gastroenterology

## 2020-07-30 ENCOUNTER — Encounter: Payer: Self-pay | Admitting: Gastroenterology

## 2020-07-30 ENCOUNTER — Other Ambulatory Visit: Payer: Self-pay

## 2020-07-30 ENCOUNTER — Ambulatory Visit (AMBULATORY_SURGERY_CENTER): Payer: Medicare Other | Admitting: Gastroenterology

## 2020-07-30 VITALS — BP 138/84 | HR 69 | Temp 97.9°F | Resp 8 | Ht 62.0 in | Wt 171.0 lb

## 2020-07-30 DIAGNOSIS — K635 Polyp of colon: Secondary | ICD-10-CM

## 2020-07-30 DIAGNOSIS — Z86010 Personal history of colonic polyps: Secondary | ICD-10-CM | POA: Diagnosis not present

## 2020-07-30 DIAGNOSIS — D123 Benign neoplasm of transverse colon: Secondary | ICD-10-CM

## 2020-07-30 MED ORDER — SODIUM CHLORIDE 0.9 % IV SOLN: 500.0000 mL | Freq: Once | INTRAVENOUS | Status: DC

## 2020-07-30 NOTE — Progress Notes (Signed)
C.W. VITAL SIGNS. 

## 2020-07-30 NOTE — Progress Notes (Signed)
Pt's states no medical or surgical changes since previsit or office visit. 

## 2020-07-30 NOTE — Op Note (Signed)
[facility] Patient Name: [name] Procedure Date: 07/30/2020 2:53 PM MRN: [id number] Endoscopist: Mauri Pole , MD Age: 69 Referring MD:  Date of Birth: 07-Aug-1951 Gender: Female Account #: [id number] Procedure:                Colonoscopy Indications:              High risk colon cancer surveillance: Personal                            history of colonic polyps, High risk colon cancer                            surveillance: Personal history of adenoma (10 mm or                            greater in size), High risk colon cancer                            surveillance: Personal history of sessile serrated                            colon polyp (10 mm or greater in size) Medicines:                Monitored Anesthesia Care Procedure:                Pre-Anesthesia Assessment:                           - Prior to the procedure, a History and Physical                            was performed, and patient medications and                            allergies were reviewed. The patient's tolerance of                            previous anesthesia was also reviewed. The risks                            and benefits of the procedure and the sedation                            options and risks were discussed with the patient.                            All questions were answered, and informed consent                            was obtained. Prior Anticoagulants: The patient has                            taken no previous anticoagulant or antiplatelet  agents. ASA Grade Assessment: II - A patient with                            mild systemic disease. After reviewing the risks                            and benefits, the patient was deemed in                            satisfactory condition to undergo the procedure.                           After obtaining informed consent, the colonoscope                            was passed under direct  vision. Throughout the                            procedure, the patient's blood pressure, pulse, and                            oxygen saturations were monitored continuously. The                            Colonoscope was introduced through the anus and                            advanced to the the cecum, identified by                            appendiceal orifice and ileocecal valve. The                            colonoscopy was performed without difficulty. The                            patient tolerated the procedure well. The quality                            of the bowel preparation was good. The ileocecal                            valve, appendiceal orifice, and rectum were                            photographed. Scope In: 3:12:05 PM Scope Out: 3:25:19 PM Scope Withdrawal Time: 0 hours 9 minutes 53 seconds  Total Procedure Duration: 0 hours 13 minutes 14 seconds  Findings:                 The perianal and digital rectal examinations were                            normal.  Four semi-sessile polyps were found in the                            transverse colon. The polyps were 4 to 12 mm in                            size. These polyps were removed with a cold snare.                            Resection and retrieval were complete.                           Scattered small-mouthed diverticula were found in                            the sigmoid colon and descending colon.                           Non-bleeding external and internal hemorrhoids were                            found during retroflexion. The hemorrhoids were                            medium-sized. Complications:            No immediate complications. Estimated Blood Loss:     Estimated blood loss was minimal. Impression:               - Four 4 to 12 mm polyps in the transverse colon,                            removed with a cold snare. Resected and retrieved.                            - Diverticulosis in the sigmoid colon and in the                            descending colon.                           - Non-bleeding external and internal hemorrhoids. Recommendation:           - Patient has a contact number available for                            emergencies. The signs and symptoms of potential                            delayed complications were discussed with the                            patient. Return to normal activities tomorrow.  Written discharge instructions were provided to the                            patient.                           - Resume previous diet.                           - Continue present medications.                           - Await pathology results.                           - Repeat colonoscopy in 3 years for surveillance                            based on pathology results. Mauri Pole, MD 07/30/2020 3:32:39 PM This report has been signed electronically.

## 2020-07-30 NOTE — Progress Notes (Signed)
Report to PACU, RN, vss, BBS= Clear.  

## 2020-07-30 NOTE — Patient Instructions (Signed)
Thank you for allowing [place] to care for you today!  Await final biopsy results, approximately 7-10 days by mail.  Will make recommendation at that time for next colonoscopy at that time, most likely 3 years.  Resume previous diet today.  Resume previous medications today.  Resume your normal activities tomorrow, November 4.   YOU HAD AN ENDOSCOPIC PROCEDURE TODAY AT [facility]:   Refer to the procedure report that was given to you for any specific questions about what was found during the examination.  If the procedure report does not answer your questions, please call your gastroenterologist to clarify.  If you requested that your care partner not be given the details of your procedure findings, then the procedure report has been included in a sealed envelope for you to review at your convenience later.  YOU SHOULD EXPECT: Some feelings of bloating in the abdomen. Passage of more gas than usual.  Walking can help get rid of the air that was put into your GI tract during the procedure and reduce the bloating. If you had a lower endoscopy (such as a colonoscopy or flexible sigmoidoscopy) you may notice spotting of blood in your stool or on the toilet paper. If you underwent a bowel prep for your procedure, you may not have a normal bowel movement for a few days.  Please Note:  You might notice some irritation and congestion in your nose or some drainage.  This is from the oxygen used during your procedure.  There is no need for concern and it should clear up in a day or so.  SYMPTOMS TO REPORT IMMEDIATELY:   Following lower endoscopy (colonoscopy or flexible sigmoidoscopy):  Excessive amounts of blood in the stool  Significant tenderness or worsening of abdominal pains  Swelling of the abdomen that is new, acute  Fever of 100F or higher   For urgent or emergent issues, a gastroenterologist can be reached at any hour by calling [phone]. Do not use MyChart messaging  for urgent concerns.    DIET:  We do recommend a small meal at first, but then you may proceed to your regular diet.  Drink plenty of fluids but you should avoid alcoholic beverages for 24 hours.  ACTIVITY:  You should plan to take it easy for the rest of today and you should NOT DRIVE or use heavy machinery until tomorrow (because of the sedation medicines used during the test).    FOLLOW UP: Our staff will call the number listed on your records 48-72 hours following your procedure to check on you and address any questions or concerns that you may have regarding the information given to you following your procedure. If we do not reach you, we will leave a message.  We will attempt to reach you two times.  During this call, we will ask if you have developed any symptoms of COVID 19. If you develop any symptoms (ie: fever, flu-like symptoms, shortness of breath, cough etc.) before then, please call [phone].  If you test positive for Covid 19 in the 2 weeks post procedure, please call and report this information to [place].    If any biopsies were taken you will be contacted by phone or by letter within the next 1-3 weeks.  Please call us at [phone] if you have not heard about the biopsies in 3 weeks.    SIGNATURES/CONFIDENTIALITY: You and/or your care partner have signed paperwork which will be entered into your electronic medical record.  These signatures attest to the fact that that the information above on your After Visit Summary has been reviewed and is understood.  Full responsibility of the confidentiality of this discharge information lies with you and/or your care-partner.

## 2020-07-30 NOTE — Progress Notes (Signed)
Called to room to assist during endoscopic procedure.  Patient ID and intended procedure confirmed with present staff. Received instructions for my participation in the procedure from the performing physician.  

## 2020-08-01 ENCOUNTER — Telehealth: Payer: Self-pay

## 2020-08-01 NOTE — Telephone Encounter (Signed)
Pt is doing well, will call back if any complications

## 2020-08-01 NOTE — Telephone Encounter (Signed)
Could not LVM

## 2020-08-11 ENCOUNTER — Encounter: Payer: Self-pay | Admitting: Gastroenterology

## 2020-08-13 ENCOUNTER — Telehealth: Payer: Self-pay | Admitting: Gastroenterology

## 2020-08-14 NOTE — Telephone Encounter (Signed)
Patient wants to know if she can reduce her colon cancer risks by taking 81mg  of ASA every day. She asked about the 3 year recall wanting to know if she needed to have screening colonoscopy more frequently. Thanks

## 2020-08-14 NOTE — Telephone Encounter (Signed)
Data is not very clear if there is significant benefit without potential risks associated with low dose ASA 81, more recent data advises against it. Advise her to cont with diet rich in fruits, vegetables and whole grains. Limit processed foods. Recall colonoscopy is recommended in 3 years. Thanks

## 2020-08-15 NOTE — Telephone Encounter (Signed)
Information sent to the patient via My Chart.

## 2020-12-26 ENCOUNTER — Telehealth: Payer: Medicare Other | Admitting: Family

## 2020-12-26 ENCOUNTER — Other Ambulatory Visit: Payer: Self-pay | Admitting: Family

## 2020-12-26 DIAGNOSIS — J069 Acute upper respiratory infection, unspecified: Secondary | ICD-10-CM

## 2020-12-26 NOTE — Progress Notes (Signed)
Attempted to call patient at both numbers listed; cell phone did not answer and voicemail was full; home number was busy; will not charge for appointment.

## 2020-12-31 ENCOUNTER — Other Ambulatory Visit: Payer: Self-pay

## 2020-12-31 ENCOUNTER — Ambulatory Visit (INDEPENDENT_AMBULATORY_CARE_PROVIDER_SITE_OTHER): Payer: Medicare Other | Admitting: Internal Medicine

## 2020-12-31 ENCOUNTER — Ambulatory Visit (INDEPENDENT_AMBULATORY_CARE_PROVIDER_SITE_OTHER): Payer: Medicare Other

## 2020-12-31 ENCOUNTER — Encounter: Payer: Self-pay | Admitting: Internal Medicine

## 2020-12-31 VITALS — BP 148/82 | HR 96 | Temp 98.6°F | Ht 62.0 in | Wt 177.0 lb

## 2020-12-31 DIAGNOSIS — Z20822 Contact with and (suspected) exposure to covid-19: Secondary | ICD-10-CM | POA: Diagnosis not present

## 2020-12-31 DIAGNOSIS — R300 Dysuria: Secondary | ICD-10-CM | POA: Diagnosis not present

## 2020-12-31 DIAGNOSIS — J209 Acute bronchitis, unspecified: Secondary | ICD-10-CM | POA: Insufficient documentation

## 2020-12-31 DIAGNOSIS — R053 Chronic cough: Secondary | ICD-10-CM | POA: Diagnosis not present

## 2020-12-31 DIAGNOSIS — R059 Cough, unspecified: Secondary | ICD-10-CM | POA: Diagnosis not present

## 2020-12-31 DIAGNOSIS — R350 Frequency of micturition: Secondary | ICD-10-CM | POA: Diagnosis not present

## 2020-12-31 MED ORDER — CIPROFLOXACIN HCL 500 MG PO TABS
ORAL_TABLET | ORAL | 0 refills | Status: DC
Start: 2020-12-31 — End: 2021-01-08

## 2020-12-31 MED ORDER — CEPHALEXIN 500 MG PO CAPS: 500.0000 mg | ORAL_CAPSULE | Freq: Three times a day (TID) | ORAL | 0 refills | Status: DC

## 2020-12-31 MED ORDER — FLUCONAZOLE 150 MG PO TABS: 150.0000 mg | ORAL_TABLET | Freq: Every day | ORAL | 1 refills | Status: DC

## 2020-12-31 MED ORDER — BENZONATATE 100 MG PO CAPS
100.0000 mg | ORAL_CAPSULE | Freq: Every day | ORAL | 0 refills | Status: DC | PRN
Start: 2020-12-31 — End: 2021-01-16

## 2020-12-31 NOTE — Assessment & Plan Note (Signed)
Acute As she was leaving the office she stated dysuria, which we did not discuss in person We will go ahead and check urinalysis, urine culture Antibiotic prescribed should treat infection if she has 1, but will confirm with her culture

## 2020-12-31 NOTE — Progress Notes (Signed)
Subjective:    Patient ID: [name], female    DOB: 1951/07/19, 70 y.o.   MRN: [id number]  HPI She is here for an acute visit for cough, congestion, diarrhea.   She had diarrhea last week x 3-4 days and it has resolved.    Her cough started approximately one month ago.  It started with mild cold symptoms.  She does not feel that her cough has improved.  Initially she stated that her cough was dry and came in spasms throughout the day sometimes lasting an hour.  At the end of the visit she states the cough is productive of green mucus.  She think she has pneumonia.  She has not taken anything over-the-counter for the cough.  She is only been drinking fluids, which she feels does help.  She has taken more than 1 home COVID test and they have been negative.  She has done a PCR test and it was also negative.        Medications and allergies reviewed with patient and updated if appropriate.  Patient Active Problem List   Diagnosis Date Noted  . Abnormal TSH 06/25/2020  . Seborrheic dermatitis of scalp 12/22/2015  . Travel advice encounter 05/21/2015  . Well adult exam 06/20/2012  . ACTINIC KERATOSIS 07/26/2007  . KERATOSIS, SEBORRHEIC [place] 07/26/2007    Current Outpatient Medications on File Prior to Visit  Medication Sig Dispense Refill  . atovaquone-proguanil (MALARONE) 250-100 MG TABS tablet As directed for travel 30 tablet 1  . Cholecalciferol (VITAMIN D3) 2000 units capsule Take 1 capsule (2,000 Units total) by mouth daily. 100 capsule 3  . ciprofloxacin (CIPRO) 500 MG tablet TAKE 1 TABLET (500 MG TOTAL) BY MOUTH 2 (TWO) TIMES DAILY. 20 tablet 0  . fluconazole (DIFLUCAN) 150 MG tablet Take 150 mg by mouth daily.     No current facility-administered medications on file prior to visit.    Past Medical History:  Diagnosis Date  . Anemia    with a pregnancy   . GERD (gastroesophageal reflux disease)    pt denies this dx   . SVD (spontaneous vaginal delivery)     x 5    Past Surgical History:  Procedure Laterality Date  . APPENDECTOMY    . CHOLECYSTECTOMY    . COLONOSCOPY  08/23/2006   DB- normal   . DIRECT LARYNGOSCOPY N/A 01/14/2019   Procedure: DIRECT LARYNGOSCOPY;  Surgeon: Melida Quitter, MD;  Location: [place];  Service: ENT;  Laterality: N/A;  . FOREIGN BODY REMOVAL ESOPHAGEAL  01/14/2019   Procedure: Removal Foreign Body Esophageal;  Surgeon: Melida Quitter, MD;  Location: [place];  Service: ENT;;  . TONSILLECTOMY      Social History   Socioeconomic History  . Marital status: Married    Spouse name: Not on file  . Number of children: 5  . Years of education: Not on file  . Highest education level: Not on file  Occupational History  . Not on file  Tobacco Use  . Smoking status: Never Smoker  . Smokeless tobacco: Never Used  Vaping Use  . Vaping Use: Never used  Substance and Sexual Activity  . Alcohol use: No    Alcohol/week: 0.0 standard drinks  . Drug use: No  . Sexual activity: Yes    Birth control/protection: Post-menopausal  Other Topics Concern  . Not on file  Social History Narrative  . Not on file   Social Determinants of Health   Financial Resource Strain:  Not on file  Food Insecurity: Not on file  Transportation Needs: Not on file  Physical Activity: Not on file  Stress: Not on file  Social Connections: Not on file    Family History  Problem Relation Age of Onset  . Aneurysm Father        AAA  . Cancer Brother 50       eye melanoma  . Colon cancer Neg Hx   . Esophageal cancer Neg Hx   . Rectal cancer Neg Hx   . Stomach cancer Neg Hx   . Pancreatic cancer Neg Hx     Review of Systems  Constitutional: Negative for chills and fever.  HENT: Negative for congestion, ear pain, sinus pressure, sinus pain and sore throat.   Respiratory: Positive for cough. Negative for chest tightness, shortness of breath and wheezing.   Cardiovascular: Negative for chest pain.  Gastrointestinal: Negative for abdominal  pain, diarrhea, nausea and vomiting.  Genitourinary: Positive for dysuria.  Neurological: Negative for dizziness, light-headedness and headaches.       Objective:   Vitals:   12/31/20 1358  BP: (!) 148/82  Pulse: 96  Temp: 98.6 F (37 C)  SpO2: 98%   BP Readings from Last 3 Encounters:  12/31/20 (!) 148/82  07/30/20 138/84  06/25/20 140/82   Wt Readings from Last 3 Encounters:  12/31/20 177 lb (80.3 kg)  07/30/20 171 lb (77.6 kg)  06/25/20 169 lb (76.7 kg)   Body mass index is 32.37 kg/m.   Physical Exam    GENERAL APPEARANCE: Appears stated age, well appearing, NAD EYES: conjunctiva clear, no icterus HENT: bilateral tympanic membranes and ear canals normal, oropharynx with no erythema or exudates, trachea midline, no cervical or supraclavicular lymphadenopathy LUNGS: Unlabored breathing, good air entry bilaterally, clear to auscultation without wheeze or crackles CARDIOVASCULAR: Normal S1,S2 , no edema SKIN: Warm, dry   DG Chest 2 View CLINICAL DATA:  Cough for 1 month  EXAM: CHEST - 2 VIEW  COMPARISON:  Cardiac CT 07/25/2019  FINDINGS: Mild lingular scarring. The lungs appear otherwise clear. Cardiac and mediastinal margins appear normal. No blunting of the costophrenic angles. Thoracic spondylosis noted. Mild dextroconvex scoliosis at the thoracolumbar junction.  IMPRESSION: 1. No acute thoracic findings. 2. Mild lingular scarring. 3. Mild thoracolumbar scoliosis.  Electronically Signed   By: Van Clines M.D.   On: 12/31/2020 15:04     Assessment & Plan:    See Problem List for Assessment and Plan of chronic medical problems.    This visit occurred during the SARS-CoV-2 public health emergency.  Safety protocols were in place, including screening questions prior to the visit, additional usage of staff PPE, and extensive cleaning of exam room while observing appropriate contact time as indicated for disinfecting solutions.

## 2020-12-31 NOTE — Assessment & Plan Note (Signed)
Acute Ongoing for 1 month Will check chest x-ray

## 2020-12-31 NOTE — Patient Instructions (Addendum)
Have a chest xray downstairs.     We will call you with the results and let you know

## 2020-12-31 NOTE — Assessment & Plan Note (Signed)
Acute Symptoms ongoing for 1 month and not improving Given duration concern for possible bacterial cause Chest x-ray today shows no evidence of pneumonia Will try an antibiotic-Keflex 500 mg 3 times daily x1 week Tessalon Perles 100 mg 3 times daily as needed for cough Advised that she can take over-the-counter cold medications for symptom relief Rest, fluids Call if no improvement

## 2021-01-01 LAB — URINALYSIS, ROUTINE W REFLEX MICROSCOPIC
Bilirubin Urine: NEGATIVE
Hgb urine dipstick: NEGATIVE
Ketones, ur: NEGATIVE
Nitrite: NEGATIVE
RBC / HPF: NONE SEEN (ref 0–?)
Specific Gravity, Urine: 1.015 (ref 1.000–1.030)
Total Protein, Urine: NEGATIVE
Urine Glucose: NEGATIVE
Urobilinogen, UA: 0.2 (ref 0.0–1.0)
pH: 6 (ref 5.0–8.0)

## 2021-01-02 LAB — CULTURE, URINE COMPREHENSIVE

## 2021-01-07 ENCOUNTER — Telehealth: Payer: Self-pay | Admitting: Internal Medicine

## 2021-01-07 ENCOUNTER — Other Ambulatory Visit: Payer: Self-pay | Admitting: Internal Medicine

## 2021-01-07 NOTE — Telephone Encounter (Signed)
Yes please, thank you.

## 2021-01-07 NOTE — Telephone Encounter (Signed)
Patient calling to report she still has productive cough since last visit with Dr Quay Burow of 4/6 He states benzonatate (TESSALON) 100 MG capsule and  cephALEXin (KEFLEX) 500 MG capsule did not help.  Does she need another appointment?  Please advise

## 2021-01-08 ENCOUNTER — Encounter: Payer: Self-pay | Admitting: Internal Medicine

## 2021-01-08 ENCOUNTER — Ambulatory Visit (INDEPENDENT_AMBULATORY_CARE_PROVIDER_SITE_OTHER): Payer: Medicare Other | Admitting: Internal Medicine

## 2021-01-08 ENCOUNTER — Other Ambulatory Visit: Payer: Self-pay

## 2021-01-08 DIAGNOSIS — J209 Acute bronchitis, unspecified: Secondary | ICD-10-CM | POA: Diagnosis not present

## 2021-01-08 DIAGNOSIS — R059 Cough, unspecified: Secondary | ICD-10-CM

## 2021-01-08 MED ORDER — METHYLPREDNISOLONE 4 MG PO TBPK: ORAL_TABLET | ORAL | 0 refills | Status: DC

## 2021-01-08 MED ORDER — PROMETHAZINE-CODEINE 6.25-10 MG/5ML PO SYRP: 5.0000 mL | ORAL_SOLUTION | ORAL | 0 refills | Status: DC | PRN

## 2021-01-08 MED ORDER — LEVOFLOXACIN 500 MG PO TABS: 500.0000 mg | ORAL_TABLET | Freq: Every day | ORAL | 0 refills | Status: DC

## 2021-01-08 MED ORDER — FLUCONAZOLE 150 MG PO TABS: 150.0000 mg | ORAL_TABLET | Freq: Once | ORAL | 1 refills | Status: AC

## 2021-01-08 NOTE — Telephone Encounter (Signed)
Pt is seeing Dr. Camila Li today @ 10:40.Marland KitchenJohny Chess

## 2021-01-08 NOTE — Progress Notes (Signed)
Subjective:  Patient ID: [name]. [name], female    DOB: 06-02-1951  Age: 70 y.o. MRN: [id number]  CC: Cough (Pt states she I coughing up greenish phlegm)   HPI Katrina Vader. Flores presents for cough x 1 month - not better. CXR was ok. Pt took Keflex - not better. C/o green mucus. CXR was ok. COVID test was (-)  Outpatient Medications Prior to Visit  Medication Sig Dispense Refill  . benzonatate (TESSALON) 100 MG capsule Take 1 capsule (100 mg total) by mouth daily as needed for cough. 30 capsule 0  . Cholecalciferol (VITAMIN D3) 2000 units capsule Take 1 capsule (2,000 Units total) by mouth daily. 100 capsule 3  . atovaquone-proguanil (MALARONE) 250-100 MG TABS tablet As directed for travel 30 tablet 1  . cephALEXin (KEFLEX) 500 MG capsule Take 1 capsule (500 mg total) by mouth 3 (three) times daily. (Patient not taking: Reported on 01/08/2021) 21 capsule 0  . ciprofloxacin (CIPRO) 500 MG tablet TAKE 1 TABLET (500 MG TOTAL) BY MOUTH 2 (TWO) TIMES DAILY. (Patient not taking: Reported on 01/08/2021) 20 tablet 0  . fluconazole (DIFLUCAN) 150 MG tablet Take 1 tablet (150 mg total) by mouth daily. (Patient not taking: Reported on 01/08/2021) 1 tablet 1   No facility-administered medications prior to visit.    ROS: Review of Systems  Constitutional: Negative for activity change, appetite change, chills, fatigue and unexpected weight change.  HENT: Negative for congestion, mouth sores and sinus pressure.   Eyes: Negative for visual disturbance.  Respiratory: Positive for cough. Negative for chest tightness.   Gastrointestinal: Negative for abdominal pain and nausea.  Genitourinary: Negative for difficulty urinating, frequency and vaginal pain.  Musculoskeletal: Negative for back pain and gait problem.  Skin: Negative for pallor and rash.  Neurological: Negative for dizziness, tremors, weakness, numbness and headaches.  Psychiatric/Behavioral: Negative for confusion and sleep disturbance.     Objective:  BP 140/82 (BP Location: Left Arm)   Pulse 93   LMP  (LMP Unknown)   SpO2 96%   BP Readings from Last 3 Encounters:  01/08/21 140/82  12/31/20 (!) 148/82  07/30/20 138/84    Wt Readings from Last 3 Encounters:  12/31/20 177 lb (80.3 kg)  07/30/20 171 lb (77.6 kg)  06/25/20 169 lb (76.7 kg)    Physical Exam Constitutional:      General: She is not in acute distress.    Appearance: She is well-developed.  HENT:     Head: Normocephalic.     Right Ear: External ear normal.     Left Ear: External ear normal.     Nose: Nose normal.  Eyes:     General:        Right eye: No discharge.        Left eye: No discharge.     Conjunctiva/sclera: Conjunctivae normal.     Pupils: Pupils are equal, round, and reactive to light.  Neck:     Thyroid: No thyromegaly.     Vascular: No JVD.     Trachea: No tracheal deviation.  Cardiovascular:     Rate and Rhythm: Normal rate and regular rhythm.     Heart sounds: Normal heart sounds.  Pulmonary:     Effort: No respiratory distress.     Breath sounds: No stridor. No wheezing.  Abdominal:     General: Bowel sounds are normal. There is no distension.     Palpations: Abdomen is soft. There is no mass.     Tenderness:  There is no abdominal tenderness. There is no guarding or rebound.  Musculoskeletal:        General: No tenderness.     Cervical back: Normal range of motion and neck supple.  Lymphadenopathy:     Cervical: No cervical adenopathy.  Skin:    Findings: No erythema or rash.  Neurological:     Cranial Nerves: No cranial nerve deficit.     Motor: No abnormal muscle tone.     Coordination: Coordination normal.     Deep Tendon Reflexes: Reflexes normal.  Psychiatric:        Behavior: Behavior normal.        Thought Content: Thought content normal.        Judgment: Judgment normal.     The patient is coughing a lot during the visit  Lab Results  Component Value Date   WBC 7.7 06/23/2020   HGB 12.9  06/23/2020   HCT 39.4 06/23/2020   PLT 287.0 06/23/2020   GLUCOSE 92 07/18/2020   CHOL 165 06/23/2020   TRIG 70.0 06/23/2020   HDL 58.90 06/23/2020   LDLDIRECT 139.0 04/07/2009   LDLCALC 92 06/23/2020   ALT 14 07/18/2020   AST 19 07/18/2020   NA 137 07/18/2020   K 4.3 07/18/2020   CL 100 07/18/2020   CREATININE 0.75 07/18/2020   BUN 13 07/18/2020   CO2 30 07/18/2020   TSH 2.18 07/18/2020    CT CARDIAC SCORING  Addendum Date: 07/26/2019   ADDENDUM REPORT: 07/26/2019 08:25 CLINICAL DATA:  Risk stratification EXAM: Coronary Calcium Score TECHNIQUE: The patient was scanned on a [employer] scanner. Axial non-contrast 3 mm slices were carried out through the heart. The data set was analyzed on a dedicated work station and scored using the [place]. FINDINGS: Non-cardiac: See separate report from [facility]. Ascending Aorta: Normal size. Pericardium: Normal. Coronary arteries: Normal origin. IMPRESSION: Coronary calcium score of 0. This was 0 percentile for age and sex matched control. Electronically Signed   By: Ena Dawley   On: 07/26/2019 08:25   Addendum Date: 07/25/2019   ADDENDUM REPORT: 07/25/2019 17:50 CLINICAL DATA:  Risk stratification EXAM: Coronary Calcium Score TECHNIQUE: The patient was scanned on a Siemens Somatom 64 slice scanner. Axial non-contrast 3 mm slices were carried out through the heart. The data set was analyzed on a dedicated work station and scored using the [place]. FINDINGS: Non-cardiac: See separate report from [facility]. Ascending aorta: Normal size Pericardium: Normal Coronary arteries: Normal origin IMPRESSION: Coronary calcium score of 0. This was 0 percentile for age and sex matched control. [name] Electronically Signed   By: [name] M.D.   On: 07/25/2019 17:50   Result Date: 07/26/2019 EXAM: OVER-READ INTERPRETATION  CT CHEST The following report is an over-read performed by radiologist Dr. Rolm Baptise of [facility], [place] on 07/25/2019. This over-read does not include interpretation of cardiac or coronary anatomy or pathology. The coronary calcium score interpretation by the cardiologist is attached. COMPARISON:  None. FINDINGS: Vascular: Heart is normal size.  Visualized aorta normal caliber. Mediastinum/Nodes: No adenopathy in the lower mediastinum or hila. Lungs/Pleura: Visualized lungs clear.  No effusions. Upper Abdomen: Imaging into the upper abdomen shows no acute findings. Musculoskeletal: Chest wall soft tissues are unremarkable. No acute bony abnormality. IMPRESSION: No acute or significant extracardiac abnormality. Electronically Signed: By: Rolm Baptise M.D. On: 07/25/2019 16:46    Assessment & Plan:   There are no diagnoses linked to this encounter.  No orders of the defined types were placed in this encounter.    Follow-up: No follow-ups on file.  Walker Kehr, MD

## 2021-01-08 NOTE — Assessment & Plan Note (Addendum)
Worse.  Possible asthmatic bronchitis Prescribed Medrol pack.  Short-term steroid use discussed. Prescribed prom-cod syr.  Potential side effects discussed.

## 2021-01-08 NOTE — Patient Instructions (Signed)
You can use over-the-counter  "cold" medicines  such as "Tylenol cold" , "Advil cold",  "Mucinex" or" Mucinex D"  for cough and congestion.     You can use plain "Tylenol" or "Advil" for fever, chills and achyness. Use Halls or Ricola cough drops.    Please, make an appointment if you are not better or if you're worse.  

## 2021-01-08 NOTE — Assessment & Plan Note (Addendum)
Refractory.  Start Levaquin po.  Potential for tendon rupture antibiotic use complication was discussed Prom-cod syr-prescription provided Recent chest x-ray reviewed

## 2021-01-15 ENCOUNTER — Telehealth: Payer: Self-pay | Admitting: Internal Medicine

## 2021-01-15 DIAGNOSIS — R059 Cough, unspecified: Secondary | ICD-10-CM

## 2021-01-15 NOTE — Telephone Encounter (Signed)
benzonatate (TESSALON) 100 MG capsule Patient doesn't think the directions are right because shes still coughing

## 2021-01-15 NOTE — Telephone Encounter (Signed)
Patient called and said that her cough has not gotten any better. She was wondering what she needed to do next. She can be reached at [phone]. Please advise

## 2021-01-16 ENCOUNTER — Other Ambulatory Visit: Payer: Self-pay | Admitting: Internal Medicine

## 2021-01-18 NOTE — Telephone Encounter (Signed)
Promethazine with codeine syrup is a stronger cough medicine.  If she wants to use [place] can try 200 mg (2 100 mg capsules) 3 times a day as needed for cough.  Thanks

## 2021-01-21 NOTE — Telephone Encounter (Addendum)
  Patient calling to report she is still coughing. Patient is requesting labs or xray Declined next available with Dr Alain Marion     Please advise

## 2021-01-22 ENCOUNTER — Ambulatory Visit (INDEPENDENT_AMBULATORY_CARE_PROVIDER_SITE_OTHER): Payer: Medicare Other | Admitting: Emergency Medicine

## 2021-01-22 ENCOUNTER — Other Ambulatory Visit: Payer: Self-pay

## 2021-01-22 ENCOUNTER — Encounter: Payer: Self-pay | Admitting: Emergency Medicine

## 2021-01-22 VITALS — BP 150/88 | HR 91 | Temp 98.5°F | Ht 64.0 in | Wt 174.0 lb

## 2021-01-22 DIAGNOSIS — B3731 Acute candidiasis of vulva and vagina: Secondary | ICD-10-CM

## 2021-01-22 DIAGNOSIS — B373 Candidiasis of vulva and vagina: Secondary | ICD-10-CM

## 2021-01-22 DIAGNOSIS — J988 Other specified respiratory disorders: Secondary | ICD-10-CM

## 2021-01-22 DIAGNOSIS — B9789 Other viral agents as the cause of diseases classified elsewhere: Secondary | ICD-10-CM | POA: Insufficient documentation

## 2021-01-22 DIAGNOSIS — R059 Cough, unspecified: Secondary | ICD-10-CM

## 2021-01-22 MED ORDER — FLUCONAZOLE 150 MG PO TABS: 150.0000 mg | ORAL_TABLET | Freq: Once | ORAL | 0 refills | Status: AC

## 2021-01-22 NOTE — Telephone Encounter (Addendum)
Pt is seeing Dr. Rosezella Rumpf this am, and he will discussed w/pt.Marland KitchenJohny Chess

## 2021-01-22 NOTE — Progress Notes (Signed)
[name] 70 y.o.   Chief Complaint  Patient presents with  . Cough    Per patient for 2 months, Dr Camila Li patient    HISTORY OF PRESENT ILLNESS: This is a 70 y.o. female dealing with upper respiratory infection since the beginning of March. Has had 2 courses of antibiotics starting with Keflex and then levofloxacin. Still coughing but nonproductive.  Denies fever or chills. Overall slowly getting better. Taking over-the-counter cough medication, and prescription Tessalon and Phenergan with codeine. Non-smoker.  No history of asthma.  At some point took a Medrol Dosepak. Chest x-ray done earlier this month showed no signs of pneumonia.  No concerns. No new symptoms or any other significant associated symptoms.  HPI   Prior to Admission medications   Medication Sig Start Date End Date Taking? Authorizing Provider  benzonatate (TESSALON) 100 MG capsule TAKE 1 CAPSULE BY MOUTH EVERY DAY AS NEEDED COUGH 01/16/21  Yes Burns, Claudina Lick, MD  Cholecalciferol (VITAMIN D3) 2000 units capsule Take 1 capsule (2,000 Units total) by mouth daily. 01/10/17  Yes [name], [name], MD  promethazine-codeine (PHENERGAN WITH CODEINE) 6.25-10 MG/5ML syrup Take 5 mLs by mouth every 4 (four) hours as needed. 01/08/21  Yes [name], [name], MD  levofloxacin (LEVAQUIN) 500 MG tablet Take 1 tablet (500 mg total) by mouth daily. Patient not taking: Reported on 01/22/2021 01/08/21   [name], [name], MD  methylPREDNISolone (MEDROL DOSEPAK) 4 MG TBPK tablet As directed Patient not taking: Reported on 01/22/2021 01/08/21   [name], [name], MD    No Known Allergies  Patient Active Problem List   Diagnosis Date Noted  . Cough 12/31/2020  . Dysuria 12/31/2020  . Acute bronchitis 12/31/2020  . Abnormal TSH 06/25/2020  . Seborrheic dermatitis of scalp 12/22/2015  . Travel advice encounter 05/21/2015  . Well adult exam 06/20/2012  . ACTINIC KERATOSIS 07/26/2007  . KERATOSIS, SEBORRHEIC [place]  07/26/2007    Past Medical History:  Diagnosis Date  . Anemia    with a pregnancy   . GERD (gastroesophageal reflux disease)    pt denies this dx   . SVD (spontaneous vaginal delivery)    x 5    Past Surgical History:  Procedure Laterality Date  . APPENDECTOMY    . CHOLECYSTECTOMY    . COLONOSCOPY  08/23/2006   DB- normal   . DIRECT LARYNGOSCOPY N/A 01/14/2019   Procedure: DIRECT LARYNGOSCOPY;  Surgeon: Melida Quitter, MD;  Location: [facility];  Service: ENT;  Laterality: N/A;  . FOREIGN BODY REMOVAL ESOPHAGEAL  01/14/2019   Procedure: Removal Foreign Body Esophageal;  Surgeon: Melida Quitter, MD;  Location: [facility];  Service: ENT;;  . TONSILLECTOMY      Social History   Socioeconomic History  . Marital status: Married    Spouse name: Not on file  . Number of children: 5  . Years of education: Not on file  . Highest education level: Not on file  Occupational History  . Not on file  Tobacco Use  . Smoking status: Never Smoker  . Smokeless tobacco: Never Used  Vaping Use  . Vaping Use: Never used  Substance and Sexual Activity  . Alcohol use: No    Alcohol/week: 0.0 standard drinks  . Drug use: No  . Sexual activity: Yes    Birth control/protection: Post-menopausal  Other Topics Concern  . Not on file  Social History Narrative  . Not on file   Social Determinants of Health   Financial Resource Strain:  Not on file  Food Insecurity: Not on file  Transportation Needs: Not on file  Physical Activity: Not on file  Stress: Not on file  Social Connections: Not on file  Intimate Partner Violence: Not on file    Family History  Problem Relation Age of Onset  . Aneurysm Father        AAA  . Cancer Brother 50       eye melanoma  . Colon cancer Neg Hx   . Esophageal cancer Neg Hx   . Rectal cancer Neg Hx   . Stomach cancer Neg Hx   . Pancreatic cancer Neg Hx      Review of Systems  Constitutional: Negative.  Negative for chills and fever.  HENT: Negative.   Negative for congestion and sore throat.   Respiratory: Positive for cough. Negative for hemoptysis, shortness of breath and wheezing.   Cardiovascular: Negative.  Negative for chest pain and palpitations.  Gastrointestinal: Negative.  Negative for abdominal pain, diarrhea, nausea and vomiting.  Genitourinary: Negative.  Negative for dysuria and hematuria.       Vaginal itching  Skin: Negative.  Negative for rash.  Neurological: Negative.  Negative for dizziness and headaches.  All other systems reviewed and are negative.  Today's Vitals   01/22/21 1010  BP: (!) 150/88  Pulse: 91  Temp: 98.5 F (36.9 C)  TempSrc: Oral  SpO2: 98%  Weight: 174 lb (78.9 kg)  Height: 5\' 4"  (1.626 m)   Body mass index is 29.87 kg/m.   Physical Exam Vitals reviewed.  Constitutional:      Appearance: Normal appearance.  HENT:     Head: Normocephalic.     Mouth/Throat:     Mouth: Mucous membranes are moist.     Pharynx: Oropharynx is clear.  Eyes:     Extraocular Movements: Extraocular movements intact.     Conjunctiva/sclera: Conjunctivae normal.     Pupils: Pupils are equal, round, and reactive to light.  Cardiovascular:     Rate and Rhythm: Normal rate and regular rhythm.     Pulses: Normal pulses.     Heart sounds: Normal heart sounds.  Pulmonary:     Effort: Pulmonary effort is normal.     Breath sounds: Normal breath sounds.  Musculoskeletal:        General: Normal range of motion.     Cervical back: Normal range of motion and neck supple. No tenderness.  Lymphadenopathy:     Cervical: No cervical adenopathy.  Skin:    General: Skin is warm and dry.     Capillary Refill: Capillary refill takes less than 2 seconds.  Neurological:     General: No focal deficit present.     Mental Status: She is alert and oriented to person, place, and time.  Psychiatric:        Mood and Affect: Mood normal.        Behavior: Behavior normal.      ASSESSMENT & PLAN: Viral respiratory  infection Viral respiratory infection running its course.  Still complaining of cough but slowly improving.  Already finished 2 courses of antibiotics.  Requested dose of fluconazole due to vaginal itching.  No more antibiotics warranted.  Continue symptom control with over-the-counter Mucinex DM and cough drops and continue Tessalon with Phenergan with codeine as needed.  Recent chest x-ray reviewed.  No concerns. Clinically stable.  No red flag signs or symptoms.  No medical concerns identified during this visit.  Tekeshia was seen today for  cough.  Diagnoses and all orders for this visit:  Cough  Viral respiratory infection  Yeast vaginitis -     fluconazole (DIFLUCAN) 150 MG tablet; Take 1 tablet (150 mg total) by mouth once for 1 dose.    Patient Instructions   Cough, Adult A cough helps to clear your throat and lungs. A cough may be a sign of an illness or another medical condition. An acute cough may only last 2-3 weeks, while a chronic cough may last 8 or more weeks. Many things can cause a cough. They include:  Germs (viruses or bacteria) that attack the airway.  Breathing in things that bother (irritate) your lungs.  Allergies.  Asthma.  Mucus that runs down the back of your throat (postnasal drip).  Smoking.  Acid backing up from the stomach into the tube that moves food from the mouth to the stomach (gastroesophageal reflux).  Some medicines.  Lung problems.  Other medical conditions, such as heart failure or a blood clot in the lung (pulmonary embolism). Follow these instructions at home: Medicines  Take over-the-counter and prescription medicines only as told by your doctor.  Talk with your doctor before you take medicines that stop a cough (cough suppressants). Lifestyle  Do not smoke, and try not to be around smoke. Do not use any products that contain nicotine or tobacco, such as cigarettes, e-cigarettes, and chewing tobacco. If you need help  quitting, ask your doctor.  Drink enough fluid to keep your pee (urine) pale yellow.  Avoid caffeine.  Do not drink alcohol if your doctor tells you not to drink.   General instructions  Watch for any changes in your cough. Tell your doctor about them.  Always cover your mouth when you cough.  Stay away from things that make you cough, such as perfume, candles, campfire smoke, or cleaning products.  If the air is dry, use a cool mist vaporizer or humidifier in your home.  If your cough is worse at night, try using extra pillows to raise your head up higher while you sleep.  Rest as needed.  Keep all follow-up visits as told by your doctor. This is important.   Contact a doctor if:  You have new symptoms.  You cough up pus.  Your cough does not get better after 2-3 weeks, or your cough gets worse.  Cough medicine does not help your cough and you are not sleeping well.  You have pain that gets worse or pain that is not helped with medicine.  You have a fever.  You are losing weight and you do not know why.  You have night sweats. Get help right away if:  You cough up blood.  You have trouble breathing.  Your heartbeat is very fast. These symptoms may be an emergency. Do not wait to see if the symptoms will go away. Get medical help right away. Call your local emergency services (911 in the [place] Do not drive yourself to the hospital. Summary  A cough helps to clear your throat and lungs. Many things can cause a cough.  Take over-the-counter and prescription medicines only as told by your doctor.  Always cover your mouth when you cough.  Contact a doctor if you have new symptoms or you have a cough that does not get better or gets worse. This information is not intended to replace advice given to you by your health care provider. Make sure you discuss any questions you have with your health care provider.  Document Revised: 11/02/2019 Document Reviewed:  10/02/2018 Elsevier Patient Education  2021 [place], MD [facility]

## 2021-01-22 NOTE — Patient Instructions (Signed)

## 2021-01-22 NOTE — Telephone Encounter (Signed)
I'll ref [name] to see a lung specialist. She had a nl CXR recently. Thx

## 2021-01-22 NOTE — Assessment & Plan Note (Signed)
Viral respiratory infection running its course.  Still complaining of cough but slowly improving.  Already finished 2 courses of antibiotics.  Requested dose of fluconazole due to vaginal itching.  No more antibiotics warranted.  Continue symptom control with over-the-counter Mucinex DM and cough drops and continue Tessalon with Phenergan with codeine as needed.  Recent chest x-ray reviewed.  No concerns. Clinically stable.  No red flag signs or symptoms.  No medical concerns identified during this visit.

## 2021-01-22 NOTE — Addendum Note (Signed)
Addended by: Cassandria Anger on: 01/22/2021 08:28 AM   Modules accepted: Orders

## 2021-01-28 ENCOUNTER — Encounter: Payer: Self-pay | Admitting: Internal Medicine

## 2021-01-28 ENCOUNTER — Other Ambulatory Visit: Payer: Self-pay

## 2021-01-28 ENCOUNTER — Ambulatory Visit (INDEPENDENT_AMBULATORY_CARE_PROVIDER_SITE_OTHER): Payer: Medicare Other | Admitting: Internal Medicine

## 2021-01-28 DIAGNOSIS — R059 Cough, unspecified: Secondary | ICD-10-CM | POA: Diagnosis not present

## 2021-01-28 DIAGNOSIS — J209 Acute bronchitis, unspecified: Secondary | ICD-10-CM

## 2021-01-28 MED ORDER — BREO ELLIPTA 100-25 MCG/INH IN AEPB: 1.0000 | INHALATION_SPRAY | Freq: Every day | RESPIRATORY_TRACT | 5 refills | Status: DC

## 2021-01-28 NOTE — Assessment & Plan Note (Signed)
A very slow improvement..posterior-bronchitis Pulm ref, MDI use offered

## 2021-01-28 NOTE — Assessment & Plan Note (Addendum)
Prolonged post-URI Breo Claritin po Pulm ref

## 2021-01-28 NOTE — Progress Notes (Signed)
Subjective:  Patient ID: [name]. [name], female    DOB: 1951-08-26  Age: 70 y.o. MRN: [id number]  CC: Follow-up   HPI [name]. Want presents for cough f/u - 50% better on Tessalon and Prom-cod at night  Outpatient Medications Prior to Visit  Medication Sig Dispense Refill  . benzonatate (TESSALON) 100 MG capsule TAKE 1 CAPSULE BY MOUTH EVERY DAY AS NEEDED COUGH 30 capsule 0  . Cholecalciferol (VITAMIN D3) 2000 units capsule Take 1 capsule (2,000 Units total) by mouth daily. 100 capsule 3  . levofloxacin (LEVAQUIN) 500 MG tablet Take 1 tablet (500 mg total) by mouth daily. (Patient not taking: No sig reported) 7 tablet 0  . methylPREDNISolone (MEDROL DOSEPAK) 4 MG TBPK tablet As directed (Patient not taking: No sig reported) 21 tablet 0  . promethazine-codeine (PHENERGAN WITH CODEINE) 6.25-10 MG/5ML syrup Take 5 mLs by mouth every 4 (four) hours as needed. (Patient not taking: Reported on 01/28/2021) 300 mL 0   No facility-administered medications prior to visit.    ROS: Review of Systems  Constitutional: Negative for activity change, appetite change, chills, fatigue and unexpected weight change.  HENT: Negative for congestion, mouth sores and sinus pressure.   Eyes: Negative for visual disturbance.  Respiratory: Positive for cough. Negative for chest tightness and wheezing.   Gastrointestinal: Negative for abdominal pain and nausea.  Genitourinary: Negative for difficulty urinating, frequency and vaginal pain.  Musculoskeletal: Negative for back pain and gait problem.  Skin: Negative for pallor and rash.  Neurological: Negative for dizziness, tremors, weakness, numbness and headaches.  Psychiatric/Behavioral: Negative for confusion and sleep disturbance.    Objective:  BP (!) 158/70 (BP Location: Left Arm)   Pulse 77   Temp 98.2 F (36.8 C) (Oral)   LMP  (LMP Unknown)   SpO2 96%   BP Readings from Last 3 Encounters:  01/28/21 (!) 158/70  01/22/21 (!) 150/88   01/08/21 140/82    Wt Readings from Last 3 Encounters:  01/22/21 174 lb (78.9 kg)  12/31/20 177 lb (80.3 kg)  07/30/20 171 lb (77.6 kg)    Physical Exam Constitutional:      General: She is not in acute distress.    Appearance: She is well-developed. She is obese.  HENT:     Head: Normocephalic.     Right Ear: External ear normal.     Left Ear: External ear normal.     Nose: Nose normal.  Eyes:     General:        Right eye: No discharge.        Left eye: No discharge.     Conjunctiva/sclera: Conjunctivae normal.     Pupils: Pupils are equal, round, and reactive to light.  Neck:     Thyroid: No thyromegaly.     Vascular: No JVD.     Trachea: No tracheal deviation.  Cardiovascular:     Rate and Rhythm: Normal rate and regular rhythm.     Heart sounds: Normal heart sounds.  Pulmonary:     Effort: No respiratory distress.     Breath sounds: No stridor. No wheezing, rhonchi or rales.  Chest:     Chest wall: No tenderness.  Abdominal:     General: Bowel sounds are normal. There is no distension.     Palpations: Abdomen is soft. There is no mass.     Tenderness: There is no abdominal tenderness. There is no guarding or rebound.  Musculoskeletal:  General: No tenderness.     Cervical back: Normal range of motion and neck supple.  Lymphadenopathy:     Cervical: No cervical adenopathy.  Skin:    Findings: No erythema or rash.  Neurological:     Cranial Nerves: No cranial nerve deficit.     Motor: No abnormal muscle tone.     Coordination: Coordination normal.     Deep Tendon Reflexes: Reflexes normal.  Psychiatric:        Behavior: Behavior normal.        Thought Content: Thought content normal.        Judgment: Judgment normal.   coughing  Lab Results  Component Value Date   WBC 7.7 06/23/2020   HGB 12.9 06/23/2020   HCT 39.4 06/23/2020   PLT 287.0 06/23/2020   GLUCOSE 92 07/18/2020   CHOL 165 06/23/2020   TRIG 70.0 06/23/2020   HDL 58.90 06/23/2020    LDLDIRECT 139.0 04/07/2009   LDLCALC 92 06/23/2020   ALT 14 07/18/2020   AST 19 07/18/2020   NA 137 07/18/2020   K 4.3 07/18/2020   CL 100 07/18/2020   CREATININE 0.75 07/18/2020   BUN 13 07/18/2020   CO2 30 07/18/2020   TSH 2.18 07/18/2020    CT CARDIAC SCORING  Addendum Date: 07/26/2019   ADDENDUM REPORT: 07/26/2019 08:25 CLINICAL DATA:  Risk stratification EXAM: Coronary Calcium Score TECHNIQUE: The patient was scanned on a [employer] scanner. Axial non-contrast 3 mm slices were carried out through the heart. The data set was analyzed on a dedicated work station and scored using the [place]. FINDINGS: Non-cardiac: See separate report from [facility]. Ascending Aorta: Normal size. Pericardium: Normal. Coronary arteries: Normal origin. IMPRESSION: Coronary calcium score of 0. This was 0 percentile for age and sex matched control. Electronically Signed   By: Ena Dawley   On: 07/26/2019 08:25   Addendum Date: 07/25/2019   ADDENDUM REPORT: 07/25/2019 17:50 CLINICAL DATA:  Risk stratification EXAM: Coronary Calcium Score TECHNIQUE: The patient was scanned on a Siemens Somatom 64 slice scanner. Axial non-contrast 3 mm slices were carried out through the heart. The data set was analyzed on a dedicated work station and scored using the [place]. FINDINGS: Non-cardiac: See separate report from [facility]. Ascending aorta: Normal size Pericardium: Normal Coronary arteries: Normal origin IMPRESSION: Coronary calcium score of 0. This was 0 percentile for age and sex matched control. Kirk Ruths Electronically Signed   By: Kirk Ruths M.D.   On: 07/25/2019 17:50   Result Date: 07/26/2019 EXAM: OVER-READ INTERPRETATION  CT CHEST The following report is an over-read performed by radiologist Dr. Rolm Baptise of [facility], [place] on 07/25/2019. This over-read does not include interpretation of cardiac or coronary anatomy or pathology. The coronary  calcium score interpretation by the cardiologist is attached. COMPARISON:  None. FINDINGS: Vascular: Heart is normal size.  Visualized aorta normal caliber. Mediastinum/Nodes: No adenopathy in the lower mediastinum or hila. Lungs/Pleura: Visualized lungs clear.  No effusions. Upper Abdomen: Imaging into the upper abdomen shows no acute findings. Musculoskeletal: Chest wall soft tissues are unremarkable. No acute bony abnormality. IMPRESSION: No acute or significant extracardiac abnormality. Electronically Signed: By: Rolm Baptise M.D. On: 07/25/2019 16:46    Assessment & Plan:     Follow-up: No follow-ups on file.  Walker Kehr, MD

## 2021-01-30 IMAGING — CT CT HEART SCORING
2 series · 15 of 20 positions shown, 17 images · non-contrast
Comparison: None.
COMPARISON: None.
COMPARISON: None.

Addendum:
EXAM:
OVER-READ INTERPRETATION  CT CHEST

The following report is an over-read performed by radiologist Dr.
Bun Garzia [REDACTED] on 07/25/2019. This
over-read does not include interpretation of cardiac or coronary
anatomy or pathology. The coronary calcium score interpretation by
the cardiologist is attached.
CLINICAL DATA: Risk stratification
Coronary Calcium Score
TECHNIQUE: The patient was scanned on a Siemens Somatom 64 slice scanner. Axial
non-contrast 3 mm slices were carried out through the heart. The
data set was analyzed on a dedicated work station and scored using
the [place] method.
TECHNIQUE: The patient was scanned on a Siemens Force scanner. Axial

[Series 2: casc 3.0 i36f 2 bestdiast 67 % · axial · 0.33mm/px · z∈[-218,-128]mm · 8 of 40 slices shown, 10 images]
[im 5/40  vessel]
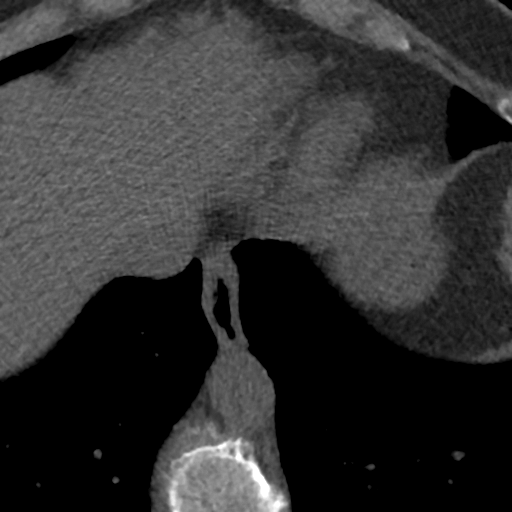
[im 5/40  lung]
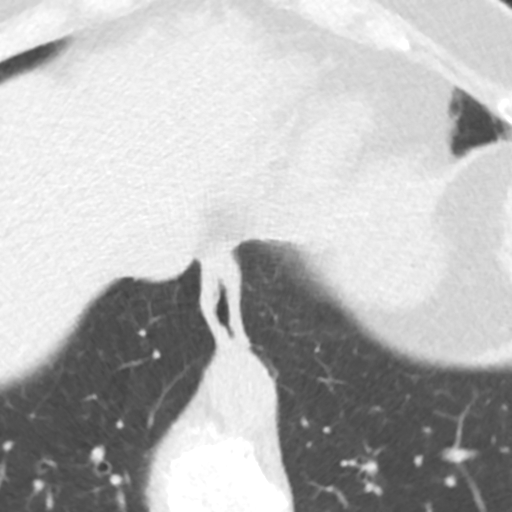
[im 9/40  vessel]
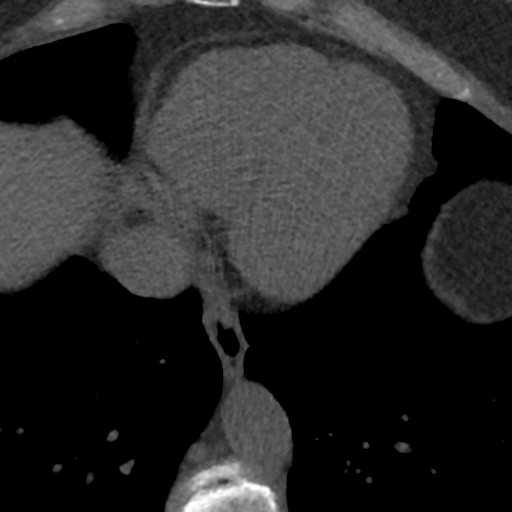
[im 14/40  vessel]
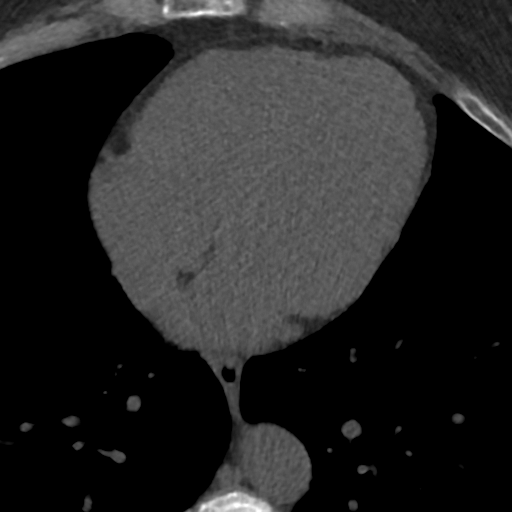
[im 18/40  vessel]
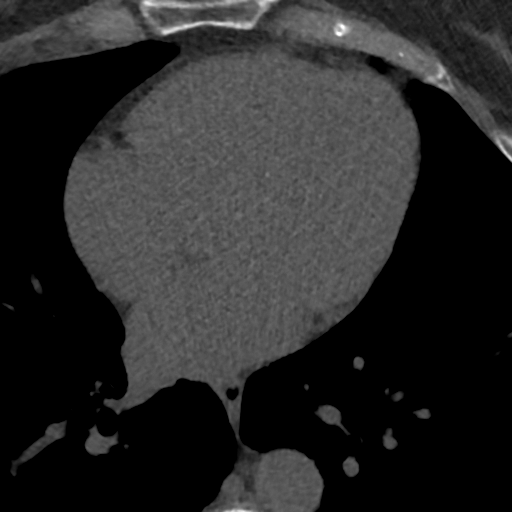
[im 22/40  vessel]
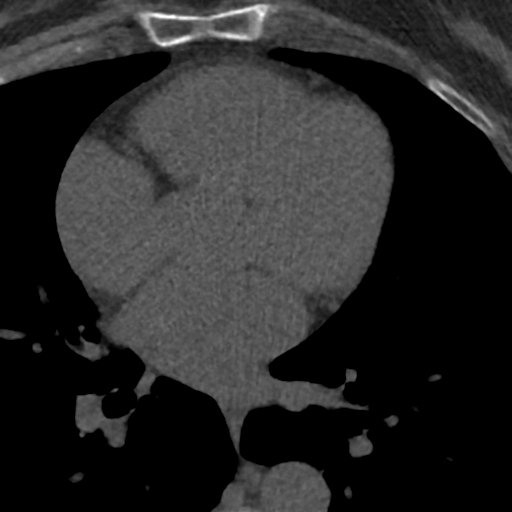
[im 22/40  lung]
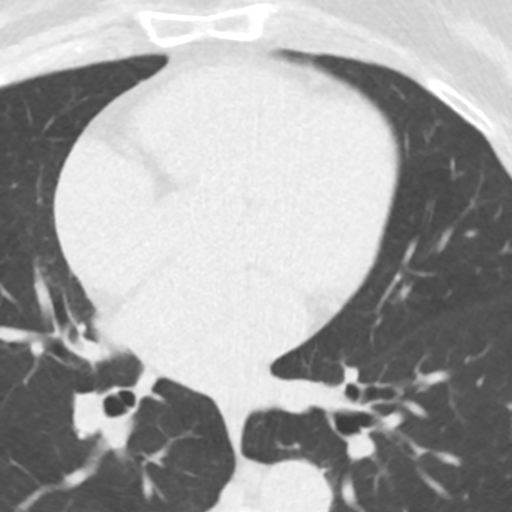
[im 27/40  vessel]
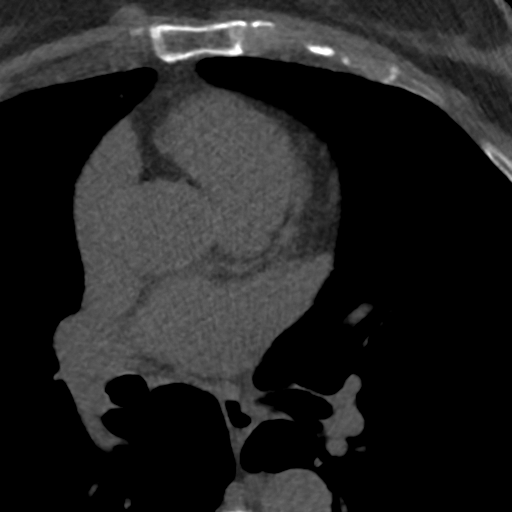
[im 31/40  vessel]
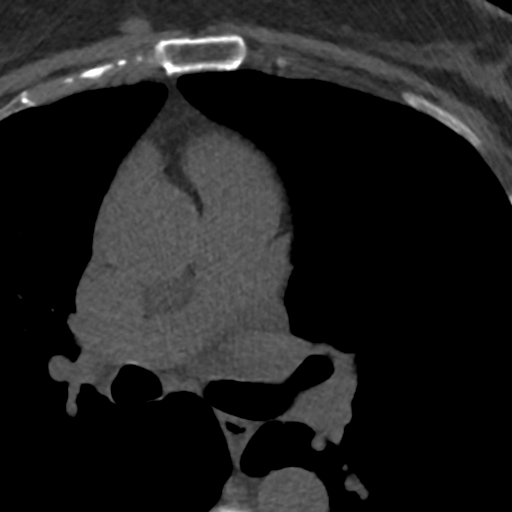
[im 35/40  vessel]
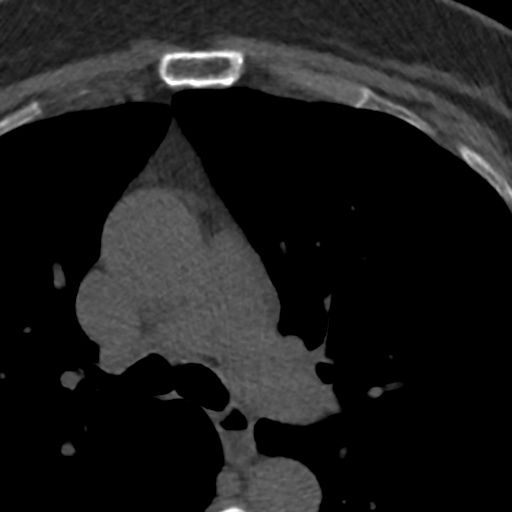

[Series 4: lung st 68 % · axial · 0.62mm/px · z∈[-220,-140]mm · 7 of 41 slices shown]
[im 5/41  lung]
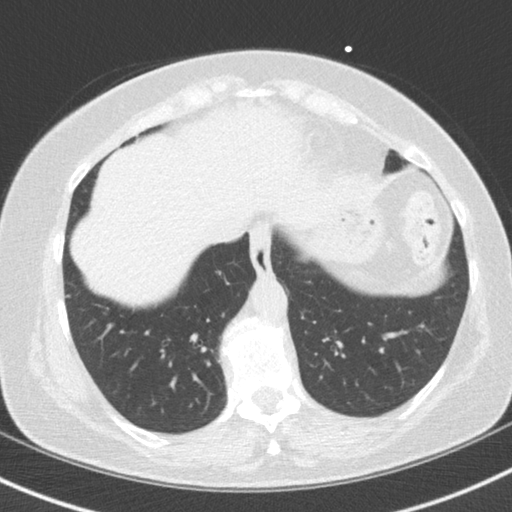
[im 9/41  lung]
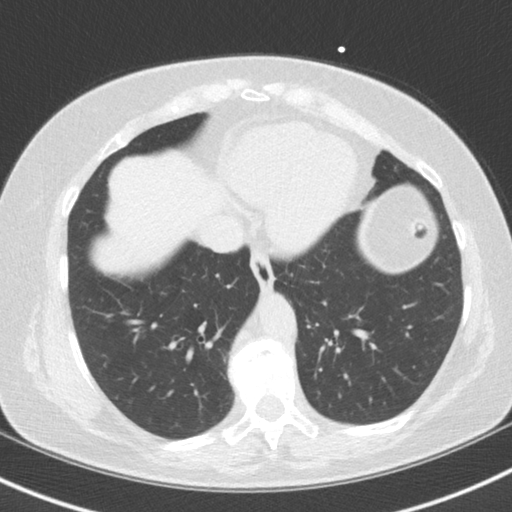
[im 14/41  lung]
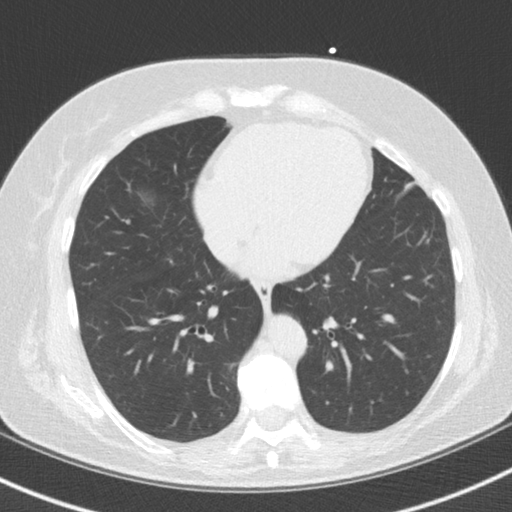
[im 18/41  lung]
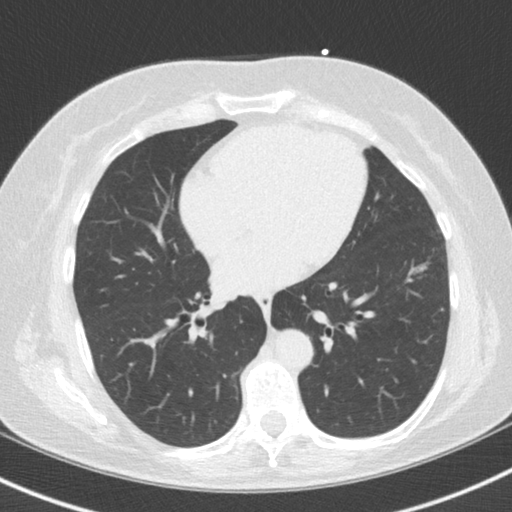
[im 23/41  lung]
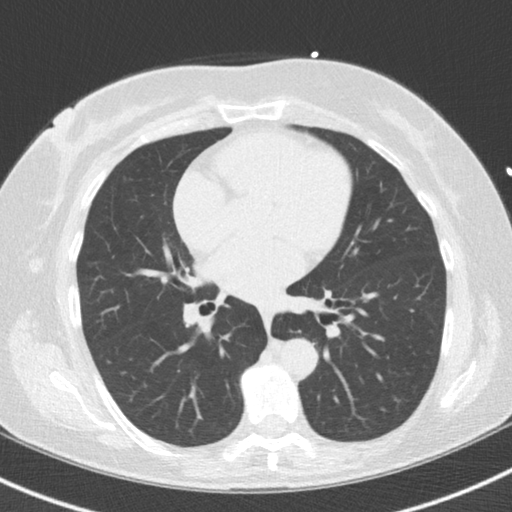
[im 27/41  lung]
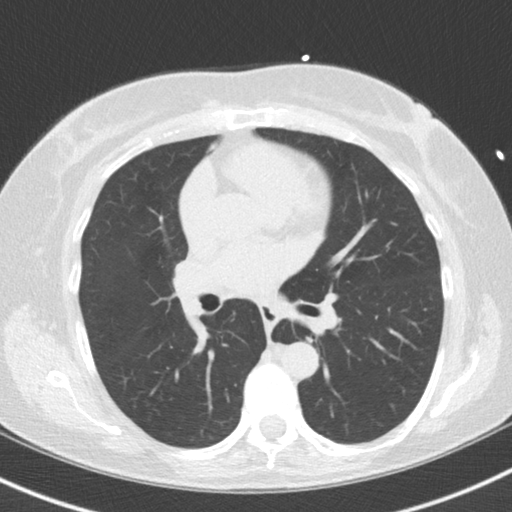
[im 32/41  lung]
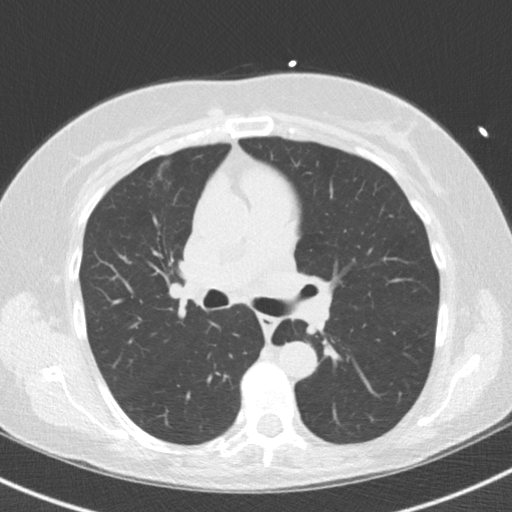

[15 of 20 positions shown; findings below may reference images not displayed]

FINDINGS: Vascular: Heart is normal size.  Visualized aorta normal caliber.

Mediastinum/Nodes: No adenopathy in the lower mediastinum or hila.

Lungs/Pleura: Visualized lungs clear.  No effusions.

Upper Abdomen: Imaging into the upper abdomen shows no acute
findings.

Musculoskeletal: Chest wall soft tissues are unremarkable. No acute
bony abnormality.
IMPRESSION: No acute or significant extracardiac abnormality.
FINDINGS: Non-cardiac: See separate report from [REDACTED].

Ascending aorta: Normal size

Pericardium: Normal

Coronary arteries: Normal origin
IMPRESSION: Coronary calcium score of 0. This was 0 percentile for age and sex
matched control.

[REDACTED]AL DATA:  Risk stratification

EXAM:
Coronary Calcium Score
FINDINGS: Non-cardiac: See separate report from [REDACTED].

Ascending Aorta: Normal size.

Pericardium: Normal.

Coronary arteries: Normal origin.
IMPRESSION: Coronary calcium score of 0. This was 0 percentile for age and sex
matched control.

*** End of Addendum ***
Addendum:
EXAM:
OVER-READ INTERPRETATION  CT CHEST

The following report is an over-read performed by radiologist Dr.
Nuyttens Nijns [REDACTED] on 07/25/2019. This
over-read does not include interpretation of cardiac or coronary
anatomy or pathology. The coronary calcium score interpretation by
the cardiologist is attached.
FINDINGS: Vascular: Heart is normal size.  Visualized aorta normal caliber.

Mediastinum/Nodes: No adenopathy in the lower mediastinum or hila.

Lungs/Pleura: Visualized lungs clear.  No effusions.

Upper Abdomen: Imaging into the upper abdomen shows no acute
findings.

Musculoskeletal: Chest wall soft tissues are unremarkable. No acute
bony abnormality.
IMPRESSION: No acute or significant extracardiac abnormality.
FINDINGS: Non-cardiac: See separate report from [REDACTED].

Ascending aorta: Normal size

Pericardium: Normal

Coronary arteries: Normal origin
IMPRESSION: Coronary calcium score of 0. This was 0 percentile for age and sex
matched control.

Bun Garzia

*** End of Addendum ***
EXAM:
OVER-READ INTERPRETATION  CT CHEST

The following report is an over-read performed by radiologist Dr.
Nuyttens Nijns [REDACTED] on 07/25/2019. This
over-read does not include interpretation of cardiac or coronary
anatomy or pathology. The coronary calcium score interpretation by
the cardiologist is attached.
FINDINGS: Vascular: Heart is normal size.  Visualized aorta normal caliber.

Mediastinum/Nodes: No adenopathy in the lower mediastinum or hila.

Lungs/Pleura: Visualized lungs clear.  No effusions.

Upper Abdomen: Imaging into the upper abdomen shows no acute
findings.

Musculoskeletal: Chest wall soft tissues are unremarkable. No acute
bony abnormality.
IMPRESSION: No acute or significant extracardiac abnormality.

## 2021-05-25 ENCOUNTER — Encounter: Payer: Self-pay | Admitting: *Deleted

## 2021-05-28 ENCOUNTER — Other Ambulatory Visit: Payer: Self-pay

## 2021-05-28 ENCOUNTER — Encounter: Payer: Self-pay | Admitting: Internal Medicine

## 2021-05-28 ENCOUNTER — Ambulatory Visit (INDEPENDENT_AMBULATORY_CARE_PROVIDER_SITE_OTHER): Payer: Medicare Other | Admitting: Internal Medicine

## 2021-05-28 VITALS — BP 158/80 | HR 96 | Ht 64.0 in | Wt 175.6 lb

## 2021-05-28 DIAGNOSIS — R059 Cough, unspecified: Secondary | ICD-10-CM | POA: Diagnosis not present

## 2021-05-28 DIAGNOSIS — L570 Actinic keratosis: Secondary | ICD-10-CM

## 2021-05-28 DIAGNOSIS — R432 Parageusia: Secondary | ICD-10-CM

## 2021-05-28 MED ORDER — BENZONATATE 200 MG PO CAPS: 200.0000 mg | ORAL_CAPSULE | Freq: Three times a day (TID) | ORAL | 1 refills | Status: DC | PRN

## 2021-05-28 NOTE — Assessment & Plan Note (Addendum)
Face x3.  Options to treat were discussed - see cryo procedure.

## 2021-05-28 NOTE — Assessment & Plan Note (Addendum)
Unclear etiology.  Will obtain chest CT Prescribed Tessalon perls prn.  Sinus irrigation with nasal rinse

## 2021-05-28 NOTE — Progress Notes (Addendum)
Subjective:  Patient ID: [name]. [name], female    DOB: [birth year]  Age: 70 y.o. MRN: [id number]  CC: Follow-up   HPI [name] presents for persistent cough -I t came back again a couple weeks ago [name] lost her sense of taste/smell on Sunday.  She is complaining of nasal congestion.  Its rather chronic.  COVID19 test was (-) [name] used Ricola for cough She decided not to use Breo after one time, because she did not like the way it made her feel C/o irritated spots on face - pt saw Dr Ubaldo Glassing in the past   Outpatient Medications Prior to Visit  Medication Sig Dispense Refill   Cholecalciferol (VITAMIN D3) 2000 units capsule Take 1 capsule (2,000 Units total) by mouth daily. 100 capsule 3   benzonatate (TESSALON) 100 MG capsule TAKE 1 CAPSULE BY MOUTH EVERY DAY AS NEEDED COUGH (Patient not taking: Reported on 05/28/2021) 30 capsule 0   fluticasone furoate-vilanterol (BREO ELLIPTA) 100-25 MCG/INH AEPB Inhale 1 puff into the lungs daily. (Patient not taking: Reported on 05/28/2021) 1 each 5   No facility-administered medications prior to visit.    ROS: Review of Systems  Constitutional:  Negative for activity change, appetite change, chills, fatigue and unexpected weight change.  HENT:  Negative for congestion, mouth sores and sinus pressure.   Eyes:  Negative for visual disturbance.  Respiratory:  Positive for cough. Negative for chest tightness.   Gastrointestinal:  Negative for abdominal pain and nausea.  Genitourinary:  Negative for difficulty urinating, frequency and vaginal pain.  Musculoskeletal:  Negative for back pain and gait problem.  Skin:  Positive for color change. Negative for pallor and rash.  Neurological:  Negative for dizziness, tremors, weakness, numbness and headaches.  Psychiatric/Behavioral:  Negative for confusion and sleep disturbance.    Objective:  BP (!) 158/80 (BP Location: Left Arm)   Pulse 96   Ht '5\' 4"'$  (1.626 m)   Wt 175 lb 9.6 oz (79.7 kg)    LMP  (LMP Unknown)   SpO2 97%   BMI 30.14 kg/m   BP Readings from Last 3 Encounters:  05/28/21 (!) 158/80  01/28/21 (!) 158/70  01/22/21 (!) 150/88    Wt Readings from Last 3 Encounters:  05/28/21 175 lb 9.6 oz (79.7 kg)  01/22/21 174 lb (78.9 kg)  12/31/20 177 lb (80.3 kg)    Physical Exam Constitutional:      General: She is not in acute distress.    Appearance: She is well-developed. She is obese.  HENT:     Head: Normocephalic.     Right Ear: External ear normal.     Left Ear: External ear normal.     Nose: Nose normal.  Eyes:     General:        Right eye: No discharge.        Left eye: No discharge.     Conjunctiva/sclera: Conjunctivae normal.     Pupils: Pupils are equal, round, and reactive to light.  Neck:     Thyroid: No thyromegaly.     Vascular: No JVD.     Trachea: No tracheal deviation.  Cardiovascular:     Rate and Rhythm: Normal rate and regular rhythm.     Heart sounds: Normal heart sounds.  Pulmonary:     Effort: No respiratory distress.     Breath sounds: No stridor. No wheezing.  Abdominal:     General: Bowel sounds are normal. There is no distension.  Palpations: Abdomen is soft. There is no mass.     Tenderness: There is no abdominal tenderness. There is no guarding or rebound.  Musculoskeletal:        General: No tenderness.     Cervical back: Normal range of motion and neck supple. No rigidity.  Lymphadenopathy:     Cervical: No cervical adenopathy.  Skin:    Findings: No erythema or rash.  Neurological:     Cranial Nerves: No cranial nerve deficit.     Motor: No abnormal muscle tone.     Coordination: Coordination normal.     Deep Tendon Reflexes: Reflexes normal.  Psychiatric:        Behavior: Behavior normal.        Thought Content: Thought content normal.        Judgment: Judgment normal.  Lentigo, AKs on face   Procedure Note :     Procedure : Cryosurgery   Indication:  Actinic keratosis(es) x3   Risks including  unsuccessful procedure , bleeding, infection, bruising, scar, a need for a repeat  procedure and others were explained to the patient in detail as well as the benefits. Informed consent was obtained verbally.     3 lesion(s)  on  face  was/were treated with liquid nitrogen on a Q-tip in a usual fasion . Band-Aid was applied and antibiotic ointment was given for a later use.   Tolerated well. Complications none.   Postprocedure instructions :     Keep the wounds clean. You can wash them with liquid soap and water. Pat dry with gauze or a Kleenex tissue  Before applying antibiotic ointment and a Band-Aid.   You need to report immediately  if  any signs of infection develop.   Lab Results  Component Value Date   WBC 7.7 06/23/2020   HGB 12.9 06/23/2020   HCT 39.4 06/23/2020   PLT 287.0 06/23/2020   GLUCOSE 92 07/18/2020   CHOL 165 06/23/2020   TRIG 70.0 06/23/2020   HDL 58.90 06/23/2020   LDLDIRECT 139.0 04/07/2009   LDLCALC 92 06/23/2020   ALT 14 07/18/2020   AST 19 07/18/2020   NA 137 07/18/2020   K 4.3 07/18/2020   CL 100 07/18/2020   CREATININE 0.75 07/18/2020   BUN 13 07/18/2020   CO2 30 07/18/2020   TSH 2.18 07/18/2020    CT CARDIAC SCORING  Addendum Date: 07/26/2019   ADDENDUM REPORT: 07/26/2019 08:25 CLINICAL DATA:  Risk stratification EXAM: Coronary Calcium Score TECHNIQUE: The patient was scanned on a [employer] scanner. Axial non-contrast 3 mm slices were carried out through the heart. The data set was analyzed on a dedicated work station and scored using the [place]. FINDINGS: Non-cardiac: See separate report from [facility]. Ascending Aorta: Normal size. Pericardium: Normal. Coronary arteries: Normal origin. IMPRESSION: Coronary calcium score of 0. This was 0 percentile for age and sex matched control. Electronically Signed   By: Ena Dawley   On: 07/26/2019 08:25   Addendum Date: 07/25/2019   ADDENDUM REPORT: 07/25/2019 17:50 CLINICAL DATA:   Risk stratification EXAM: Coronary Calcium Score TECHNIQUE: The patient was scanned on a Siemens Somatom 64 slice scanner. Axial non-contrast 3 mm slices were carried out through the heart. The data set was analyzed on a dedicated work station and scored using the [place]. FINDINGS: Non-cardiac: See separate report from [facility]. Ascending aorta: Normal size Pericardium: Normal Coronary arteries: Normal origin IMPRESSION: Coronary calcium score of 0. This was 0 percentile for  age and sex matched control. Kirk Ruths Electronically Signed   By: Kirk Ruths M.D.   On: 07/25/2019 17:50   Result Date: 07/26/2019 EXAM: OVER-READ INTERPRETATION  CT CHEST The following report is an over-read performed by radiologist Dr. Rolm Baptise of [facility], [place] on 07/25/2019. This over-read does not include interpretation of cardiac or coronary anatomy or pathology. The coronary calcium score interpretation by the cardiologist is attached. COMPARISON:  None. FINDINGS: Vascular: Heart is normal size.  Visualized aorta normal caliber. Mediastinum/Nodes: No adenopathy in the lower mediastinum or hila. Lungs/Pleura: Visualized lungs clear.  No effusions. Upper Abdomen: Imaging into the upper abdomen shows no acute findings. Musculoskeletal: Chest wall soft tissues are unremarkable. No acute bony abnormality. IMPRESSION: No acute or significant extracardiac abnormality. Electronically Signed: By: Rolm Baptise M.D. On: 07/25/2019 16:46    Assessment & Plan:       Walker Kehr, MD

## 2021-05-28 NOTE — Patient Instructions (Signed)
   Postprocedure instructions :     Keep the wounds clean. You can wash them with liquid soap and water. Pat dry with gauze or a Kleenex tissue  Before applying antibiotic ointment and a Band-Aid.   You need to report immediately  if  any signs of infection develop.    

## 2021-05-28 NOTE — Assessment & Plan Note (Addendum)
Possible COVID. Pt tested (-) however.  She does not seem to have enough congestion to explain loss of taste and smell. Prescribed sinus irrigation.

## 2021-05-29 ENCOUNTER — Encounter: Payer: Self-pay | Admitting: Internal Medicine

## 2021-06-11 ENCOUNTER — Other Ambulatory Visit: Payer: Self-pay

## 2021-06-11 ENCOUNTER — Ambulatory Visit (INDEPENDENT_AMBULATORY_CARE_PROVIDER_SITE_OTHER)
Admission: RE | Admit: 2021-06-11 | Discharge: 2021-06-11 | Disposition: A | Discharge status: Home / Self Care | Payer: Medicare Other | Source: Ambulatory Visit | Attending: Internal Medicine | Admitting: Internal Medicine

## 2021-06-11 DIAGNOSIS — J984 Other disorders of lung: Secondary | ICD-10-CM | POA: Diagnosis not present

## 2021-06-11 DIAGNOSIS — R059 Cough, unspecified: Secondary | ICD-10-CM | POA: Diagnosis not present

## 2021-06-11 DIAGNOSIS — R918 Other nonspecific abnormal finding of lung field: Secondary | ICD-10-CM

## 2021-06-12 ENCOUNTER — Telehealth: Payer: Self-pay | Admitting: *Deleted

## 2021-06-12 NOTE — Telephone Encounter (Signed)
Rec'd call report patient had a CT Chest. Results 2.8 x 1.0 cm irregular density is noted anteriorly in the left lower lobe adjacent to left major fissure concerning for neoplasm or malignancy. PET scan is recommended for further evaluation. Report is in epic.Marland KitchenJohny Chess

## 2021-06-12 NOTE — Telephone Encounter (Signed)
I will address.  Thank you

## 2021-06-13 ENCOUNTER — Other Ambulatory Visit: Payer: Self-pay | Admitting: Internal Medicine

## 2021-06-15 ENCOUNTER — Other Ambulatory Visit: Payer: Self-pay | Admitting: Internal Medicine

## 2021-06-15 ENCOUNTER — Telehealth: Payer: Self-pay

## 2021-06-15 DIAGNOSIS — R918 Other nonspecific abnormal finding of lung field: Secondary | ICD-10-CM

## 2021-06-15 NOTE — Telephone Encounter (Signed)
Please advise as the pt has called in regards to a voicemail she states was left by her provider. However, she erased the message by accident and is asking that her doctor call her back please this afternoon.

## 2021-06-16 ENCOUNTER — Other Ambulatory Visit: Payer: Self-pay | Admitting: Internal Medicine

## 2021-06-16 DIAGNOSIS — R918 Other nonspecific abnormal finding of lung field: Secondary | ICD-10-CM

## 2021-06-16 DIAGNOSIS — R911 Solitary pulmonary nodule: Secondary | ICD-10-CM

## 2021-06-16 DIAGNOSIS — Z1231 Encounter for screening mammogram for malignant neoplasm of breast: Secondary | ICD-10-CM | POA: Diagnosis not present

## 2021-06-16 NOTE — Progress Notes (Signed)
PET CT ordered.

## 2021-06-16 NOTE — Telephone Encounter (Signed)
PET CT ordered.  Pulmonary referral placed.  I will try to call her.  Thanks

## 2021-06-17 ENCOUNTER — Telehealth: Payer: Self-pay | Admitting: Internal Medicine

## 2021-06-17 NOTE — Telephone Encounter (Signed)
Patient also wanted to add to previous note if provider suggest she get her flu vaccination

## 2021-06-17 NOTE — Telephone Encounter (Signed)
Patient is requesting more details about the imaging for CAT scan whether the order was placed as a STAT or routine. Stated when she spoke to PCP she had the impression it was an urgent matter that should be done ASAP. Also requesting an update on the referral to Pulmonary. Advised that the referral was sent in on the 19th and that they would contact her to schedule an appointment but she has not heard anything back yet. The patient was wondering if she needed any additional blood work, I relayed that currently no orders were put in for additional labs. She also is concerned of the "spots" on her face whether they need to be froze off again and if she should come in sooner to have those removed.   Patient seemed to be very concern and anxious in person. Requesting a callback to clarify the matter listed above.   Best callback #: [phone]; alternative contact: Mitzi Hansen (husband) [phone]

## 2021-06-18 NOTE — Telephone Encounter (Signed)
I spoke with the patient earlier.  Pulmonary referral placed.  PET CT ordered.  She can get flu vaccination at any time. Thanks

## 2021-06-22 ENCOUNTER — Ambulatory Visit (HOSPITAL_COMMUNITY): Admission: RE | Admit: 2021-06-22 | Payer: Medicare Other | Source: Ambulatory Visit

## 2021-06-24 ENCOUNTER — Ambulatory Visit (INDEPENDENT_AMBULATORY_CARE_PROVIDER_SITE_OTHER): Payer: Medicare Other | Admitting: Emergency Medicine

## 2021-06-24 ENCOUNTER — Other Ambulatory Visit: Payer: Self-pay

## 2021-06-24 ENCOUNTER — Other Ambulatory Visit: Payer: Self-pay | Admitting: Emergency Medicine

## 2021-06-24 ENCOUNTER — Encounter: Payer: Self-pay | Admitting: Emergency Medicine

## 2021-06-24 DIAGNOSIS — R9389 Abnormal findings on diagnostic imaging of other specified body structures: Secondary | ICD-10-CM

## 2021-06-24 NOTE — Patient Instructions (Signed)
Get a PET scan on 07/01/2021 as planned. We will arrange for pulmonary function testing soon as possible. Follow with Dr. Lamonte Sakai on 07/08/2021 so that we can review the results of all your testing and plan next steps in the evaluation of your left lower lobe pulmonary nodule.

## 2021-06-24 NOTE — Assessment & Plan Note (Signed)
Irregularly shaped peripheral left lower lobe pulmonary nodule noted on CT chest in the same region as some linear opacity that was more subtle and evident on a cardiac CT from 2020.  Agree this is concerning for possible malignancy.  Discussed the options for evaluation and diagnosis with her today.  Like for her to go ahead and get the PET scan that is planned for 10/5 so that we can further restratification, look for any evidence of distant disease.  Depending on this result and also her pulmonary function testing we will decide whether to pursue navigational bronchoscopy for biopsy versus referral for primary resection.  Get a PET scan on 07/01/2021 as planned. We will arrange for pulmonary function testing soon as possible. Follow with Dr. Lamonte Sakai on 07/08/2021 so that we can review the results of all your testing and plan next steps in the evaluation of your left lower lobe pulmonary nodule.

## 2021-06-24 NOTE — Progress Notes (Signed)
Subjective:    Patient ID: [name]. [place], female    DOB: 05-22-1951, 70 y.o.   MRN: [id number]  HPI 70 year old never smoker with chart history of GERD although she denies, overall fairly healthy. She has been under evaluation for coughing since April 2022, started in February, has been treated for acute bronchitis.  She is referred today for an abnormal CT scan of the chest that was performed due to persistence of her symptoms.  A PET scan has been ordered and is pending for 07/01/2021. Her cough had improved some initially, but is now frequent. Occasionally green mucous. No hemoptysis.  No history of malignancy of any kind.  She does have a history of a pneumonia that was treated back in 2001, unsure which side.   CT chest 06/11/2021 reviewed by me, shows no mediastinal or axillary lymphadenopathy.  A 2.8 x 1.0 cm irregular density in referral the anterior left lower lobe.  This is in the same region where some very subtle linear atelectatic change was identified on a cardiac CT 07/25/2019.   Review of Systems As per HPI  Past Medical History:  Diagnosis Date   Anemia    with a pregnancy    GERD (gastroesophageal reflux disease)    pt denies this dx    SVD (spontaneous vaginal delivery)    x 5     Family History  Problem Relation Age of Onset   Aneurysm Father        AAA   Cancer Brother 59       eye melanoma   Colon cancer Neg Hx    Esophageal cancer Neg Hx    Rectal cancer Neg Hx    Stomach cancer Neg Hx    Pancreatic cancer Neg Hx      Social History   Socioeconomic History   Marital status: Married    Spouse name: Not on file   Number of children: 5   Years of education: Not on file   Highest education level: Not on file  Occupational History   Not on file  Tobacco Use   Smoking status: Never   Smokeless tobacco: Never  Vaping Use   Vaping Use: Never used  Substance and Sexual Activity   Alcohol use: No    Alcohol/week: 0.0 standard drinks   Drug  use: No   Sexual activity: Yes    Birth control/protection: Post-menopausal  Other Topics Concern   Not on file  Social History Narrative   Not on file   Social Determinants of Health   Financial Resource Strain: Not on file  Food Insecurity: Not on file  Transportation Needs: Not on file  Physical Activity: Not on file  Stress: Not on file  Social Connections: Not on file  Intimate Partner Violence: Not on file     No Known Allergies   Outpatient Medications Prior to Visit  Medication Sig Dispense Refill   Cholecalciferol (VITAMIN D3) 2000 units capsule Take 1 capsule (2,000 Units total) by mouth daily. 100 capsule 3   benzonatate (TESSALON) 200 MG capsule Take 1 capsule (200 mg total) by mouth 3 (three) times daily as needed for cough. (Patient not taking: Reported on 06/24/2021) 60 capsule 1   No facility-administered medications prior to visit.         Objective:   Physical Exam  Vitals:   06/24/21 1351  BP: (!) 145/90  Pulse: 77  Temp: 97.8 F (36.6 C)  TempSrc: Oral  SpO2: 98%  Weight:  174 lb 7.2 oz (79.1 kg)  Height: 5\' 4"  (1.626 m)   Gen: Pleasant, overwt woman, in no distress,  normal affect  ENT: No lesions,  mouth clear,  oropharynx clear, no postnasal drip  Neck: No JVD, no stridor  Lungs: No use of accessory muscles, no crackles or wheezing on normal respiration, no wheeze on forced expiration  Cardiovascular: RRR, heart sounds normal, no murmur or gallops, no peripheral edema  Musculoskeletal: No deformities, no cyanosis or clubbing  Neuro: alert, awake, non focal, tangential historian  Skin: Warm, no lesions or rash      Assessment & Plan:   Abnormal CT of the chest Irregularly shaped peripheral left lower lobe pulmonary nodule noted on CT chest in the same region as some linear opacity that was more subtle and evident on a cardiac CT from 2020.  Agree this is concerning for possible malignancy.  Discussed the options for evaluation and  diagnosis with her today.  Like for her to go ahead and get the PET scan that is planned for 10/5 so that we can further restratification, look for any evidence of distant disease.  Depending on this result and also her pulmonary function testing we will decide whether to pursue navigational bronchoscopy for biopsy versus referral for primary resection.  Get a PET scan on 07/01/2021 as planned. We will arrange for pulmonary function testing soon as possible. Follow with Dr. Lamonte Sakai on 07/08/2021 so that we can review the results of all your testing and plan next steps in the evaluation of your left lower lobe pulmonary nodule.   Baltazar Apo, MD, PhD 06/24/2021, 2:32 PM  Pulmonary and Critical Care [phone] or if no answer before 7:00PM call [phone] For any issues after 7:00PM please call eLink [phone]

## 2021-06-24 NOTE — Progress Notes (Signed)
Pft   

## 2021-06-25 ENCOUNTER — Telehealth: Payer: Self-pay | Admitting: Emergency Medicine

## 2021-06-25 ENCOUNTER — Ambulatory Visit (INDEPENDENT_AMBULATORY_CARE_PROVIDER_SITE_OTHER): Payer: Medicare Other | Admitting: Emergency Medicine

## 2021-06-25 DIAGNOSIS — R9389 Abnormal findings on diagnostic imaging of other specified body structures: Secondary | ICD-10-CM | POA: Diagnosis not present

## 2021-06-25 LAB — PULMONARY FUNCTION TEST
DL/VA % pred: 111 %
DL/VA: 4.58 ml/min/mmHg/L
DLCO cor % pred: 101 %
DLCO cor: 20.87 ml/min/mmHg
DLCO unc % pred: 101 %
DLCO unc: 20.87 ml/min/mmHg
FEF 25-75 Post: 2.24 L/sec
FEF 25-75 Pre: 1.45 L/sec
FEF2575-%Change-Post: 54 %
FEF2575-%Pred-Post: 107 %
FEF2575-%Pred-Pre: 69 %
FEV1-%Change-Post: 9 %
FEV1-%Pred-Post: 82 %
FEV1-%Pred-Pre: 75 %
FEV1-Post: 2.03 L
FEV1-Pre: 1.86 L
FEV1FVC-%Change-Post: 5 %
FEV1FVC-%Pred-Pre: 100 %
FEV6-%Change-Post: 3 %
FEV6-%Pred-Post: 80 %
FEV6-%Pred-Pre: 77 %
FEV6-Post: 2.51 L
FEV6-Pre: 2.42 L
FEV6FVC-%Change-Post: 0 %
FEV6FVC-%Pred-Post: 104 %
FEV6FVC-%Pred-Pre: 104 %
FVC-%Change-Post: 3 %
FVC-%Pred-Post: 77 %
FVC-%Pred-Pre: 74 %
FVC-Post: 2.51 L
FVC-Pre: 2.42 L
Post FEV1/FVC ratio: 81 %
Post FEV6/FVC ratio: 100 %
Pre FEV1/FVC ratio: 77 %
Pre FEV6/FVC Ratio: 100 %
RV % pred: 107 %
RV: 2.38 L
TLC % pred: 93 %
TLC: 4.95 L

## 2021-06-25 NOTE — Telephone Encounter (Signed)
Called and spoke with Patient.  Patient requested OV with Dr. Lamonte Sakai for 07/03/21,. Dr. Lamonte Sakai is not in office 07/03/21.  Patient stated she was scheduled 07/03/21,  but OV was cancelled. Patient scheduled 07/01/21 for PET scan and requested to see Dr. Lamonte Sakai after for results.   Patient is scheduled 07/08/21 with Dr. Lamonte Sakai. Patient is requesting OV for 07/02/21 to review PET scan results with Dr. Lamonte Sakai.  Patient is requesting to be worked in.  Patient stated she was told by front staff that she could be double booked.  Advised Patient I would send Dr. Agustina Caroli nurse a message as Juluis Rainier, for any cancellations or if Dr. Lamonte Sakai would have results to review 07/02/21.   Message routed to [facility]

## 2021-06-25 NOTE — Telephone Encounter (Signed)
Patient has a PET scan on 07/01/2021 and states her follow up on 10/12 with RB is too long of a wait after the scan. Informed patient there are no opening with RB and she does not want to see an APP. She insists on getting in after her PET scan to discuss. Also wanted to know if there are any labs that can be ordered that may explain why she is coughing.   Patient wanted Dr. Lamonte Sakai aware of the xrays done in April 2022 ordered by Dr. Quay Burow and the xrays done after 06/07/2000 ordered by Dr. Judi Cong. Also, 2 months ago she was coughing up rust-colored mucus on and off for two weeks.  Please advise.

## 2021-06-25 NOTE — Progress Notes (Signed)
PFT done today. 

## 2021-07-01 ENCOUNTER — Ambulatory Visit (HOSPITAL_COMMUNITY): Payer: Medicare Other

## 2021-07-01 ENCOUNTER — Ambulatory Visit (HOSPITAL_COMMUNITY)
Admission: RE | Admit: 2021-07-01 | Discharge: 2021-07-01 | Disposition: A | Discharge status: Home / Self Care | Payer: Medicare Other | Source: Ambulatory Visit | Attending: Internal Medicine | Admitting: Internal Medicine

## 2021-07-01 ENCOUNTER — Other Ambulatory Visit: Payer: Self-pay

## 2021-07-01 DIAGNOSIS — Z131 Encounter for screening for diabetes mellitus: Secondary | ICD-10-CM | POA: Insufficient documentation

## 2021-07-01 DIAGNOSIS — M40204 Unspecified kyphosis, thoracic region: Secondary | ICD-10-CM | POA: Diagnosis not present

## 2021-07-01 DIAGNOSIS — R918 Other nonspecific abnormal finding of lung field: Secondary | ICD-10-CM | POA: Diagnosis not present

## 2021-07-01 DIAGNOSIS — Z9049 Acquired absence of other specified parts of digestive tract: Secondary | ICD-10-CM | POA: Diagnosis not present

## 2021-07-01 DIAGNOSIS — J320 Chronic maxillary sinusitis: Secondary | ICD-10-CM | POA: Diagnosis not present

## 2021-07-01 DIAGNOSIS — R911 Solitary pulmonary nodule: Secondary | ICD-10-CM | POA: Insufficient documentation

## 2021-07-01 LAB — GLUCOSE, CAPILLARY: Glucose-Capillary: 114 mg/dL — ABNORMAL HIGH (ref 70–99)

## 2021-07-01 MED ORDER — FLUDEOXYGLUCOSE F - 18 (FDG) INJECTION
8.6000 | Freq: Once | INTRAVENOUS | Status: AC | PRN
  Administered 2021-07-01: 8.6 via INTRAVENOUS

## 2021-07-02 NOTE — Telephone Encounter (Signed)
Call made to patient, confirmed DOB. Confimed appt. Made patient aware it was in her best interest to keep her appt with RB being that she is wanting to discuss in detail her results and it not willing to see an app. Voiced understanding.   Nothing further needed at this time.

## 2021-07-03 ENCOUNTER — Institutional Professional Consult (permissible substitution): Payer: Medicare Other | Admitting: Student

## 2021-07-08 ENCOUNTER — Ambulatory Visit (INDEPENDENT_AMBULATORY_CARE_PROVIDER_SITE_OTHER): Payer: Medicare Other | Admitting: Internal Medicine

## 2021-07-08 ENCOUNTER — Encounter: Payer: Self-pay | Admitting: Internal Medicine

## 2021-07-08 ENCOUNTER — Encounter: Payer: Self-pay | Admitting: Emergency Medicine

## 2021-07-08 ENCOUNTER — Ambulatory Visit (INDEPENDENT_AMBULATORY_CARE_PROVIDER_SITE_OTHER): Payer: Medicare Other | Admitting: Emergency Medicine

## 2021-07-08 ENCOUNTER — Other Ambulatory Visit: Payer: Self-pay

## 2021-07-08 DIAGNOSIS — R053 Chronic cough: Secondary | ICD-10-CM

## 2021-07-08 DIAGNOSIS — R9389 Abnormal findings on diagnostic imaging of other specified body structures: Secondary | ICD-10-CM

## 2021-07-08 DIAGNOSIS — J0100 Acute maxillary sinusitis, unspecified: Secondary | ICD-10-CM

## 2021-07-08 DIAGNOSIS — J019 Acute sinusitis, unspecified: Secondary | ICD-10-CM | POA: Insufficient documentation

## 2021-07-08 DIAGNOSIS — L570 Actinic keratosis: Secondary | ICD-10-CM | POA: Diagnosis not present

## 2021-07-08 MED ORDER — FLUCONAZOLE 150 MG PO TABS: 150.0000 mg | ORAL_TABLET | Freq: Once | ORAL | 1 refills | Status: DC

## 2021-07-08 MED ORDER — CEFDINIR 300 MG PO CAPS: 300.0000 mg | ORAL_CAPSULE | Freq: Two times a day (BID) | ORAL | 0 refills | Status: DC

## 2021-07-08 NOTE — Assessment & Plan Note (Addendum)
Appt w/Dr Lamonte Sakai today Tessalon Cough syr prn

## 2021-07-08 NOTE — Assessment & Plan Note (Signed)
New Cefdinir x 10 d and Diflucan

## 2021-07-08 NOTE — Progress Notes (Signed)
Subjective:    Patient ID: [name]. [place], female    DOB: Jan 31, 1951, 70 y.o.   MRN: [id number]  HPI 70 year old never smoker with chart history of GERD although she denies, overall fairly healthy. She has been under evaluation for coughing since April 2022, started in February, has been treated for acute bronchitis.  She is referred today for an abnormal CT scan of the chest that was performed due to persistence of her symptoms.  A PET scan has been ordered and is pending for 07/01/2021. Her cough had improved some initially, but is now frequent. Occasionally green mucous. No hemoptysis.  No history of malignancy of any kind.  She does have a history of a pneumonia that was treated back in 2001, unsure which side.  CT chest 06/11/2021 reviewed by me, shows no mediastinal or axillary lymphadenopathy.  A 2.8 x 1.0 cm irregular density in referral the anterior left lower lobe.  This is in the same region where some very subtle linear atelectatic change was identified on a cardiac CT 07/25/2019.   ROV 07/08/2021 --follow-up visit 70 year old never smoker who has been dealing with chronic cough, chronic bronchitic symptoms.  I saw her for this and also for an abnormal CT scan of the chest that identified a 2.8 x 1.0 cm irregular anterior left lower lobe density.  Appeared to have some atelectatic change on a cardiac CT at that location in 06/2019.  PET scan done on 07/01/2021 reviewed by me showed no change in size or appearance of the left lower lobe pulmonary nodule, no hypermetabolism with an SUV of 2.1.  Pulmonary function testing performed 06/25/2021 reviewed by me, show mixed obstruction and restriction with an FEV1 75% predicted, normal lung volumes, normal diffusion capacity.   Review of Systems As per HPI  Past Medical History:  Diagnosis Date   Anemia    with a pregnancy    GERD (gastroesophageal reflux disease)    pt denies this dx    SVD (spontaneous vaginal delivery)    x 5      Family History  Problem Relation Age of Onset   Aneurysm Father        AAA   Cancer Brother 12       eye melanoma   Colon cancer Neg Hx    Esophageal cancer Neg Hx    Rectal cancer Neg Hx    Stomach cancer Neg Hx    Pancreatic cancer Neg Hx      Social History   Socioeconomic History   Marital status: Married    Spouse name: Not on file   Number of children: 5   Years of education: Not on file   Highest education level: Not on file  Occupational History   Not on file  Tobacco Use   Smoking status: Never   Smokeless tobacco: Never  Vaping Use   Vaping Use: Never used  Substance and Sexual Activity   Alcohol use: No    Alcohol/week: 0.0 standard drinks   Drug use: No   Sexual activity: Yes    Birth control/protection: Post-menopausal  Other Topics Concern   Not on file  Social History Narrative   Not on file   Social Determinants of Health   Financial Resource Strain: Not on file  Food Insecurity: Not on file  Transportation Needs: Not on file  Physical Activity: Not on file  Stress: Not on file  Social Connections: Not on file  Intimate Partner Violence: Not on file  No Known Allergies   Outpatient Medications Prior to Visit  Medication Sig Dispense Refill   cefdinir (OMNICEF) 300 MG capsule Take 1 capsule (300 mg total) by mouth 2 (two) times daily. 20 capsule 0   fluconazole (DIFLUCAN) 150 MG tablet Take 1 tablet (150 mg total) by mouth once for 1 dose. 1 tablet 1   benzonatate (TESSALON) 200 MG capsule Take 1 capsule (200 mg total) by mouth 3 (three) times daily as needed for cough. (Patient not taking: No sig reported) 60 capsule 1   Cholecalciferol (VITAMIN D3) 2000 units capsule Take 1 capsule (2,000 Units total) by mouth daily. (Patient not taking: No sig reported) 100 capsule 3   No facility-administered medications prior to visit.         Objective:   Physical Exam  Vitals:   07/08/21 1151  BP: 130/78  Pulse: 73  Temp: 97.6 F  (36.4 C)  TempSrc: Oral  SpO2: 96%  Weight: 172 lb 3.2 oz (78.1 kg)  Height: 5\' 5"  (1.651 m)   Gen: Pleasant, overwt woman, in no distress,  normal affect  ENT: No lesions,  mouth clear,  oropharynx clear, no postnasal drip  Neck: No JVD, no stridor  Lungs: No use of accessory muscles, no crackles or wheezing on normal respiration, no wheeze on forced expiration  Cardiovascular: RRR, heart sounds normal, no murmur or gallops, no peripheral edema  Musculoskeletal: No deformities, no cyanosis or clubbing  Neuro: alert, awake, non focal, tangential historian  Skin: Warm, no lesions or rash      Assessment & Plan:   Abnormal CT of the chest Her left lower lobe pulmonary nodules unchanged in size or appearance.  No hypermetabolism on PET scan.  Reviewed this with her today.  I still have some concern about the fact that it appears to have evolved going back to 06/2019.  We will continue to follow it, next scan in 6 months.  If there is an interval change we will talk about strategies for either biopsy or resection.  We reviewed your PET scan and your pulmonary function testing today. We will plan to repeat your CT scan of the chest in April 2023.   Follow with Dr. Lamonte Sakai in April after your CT scan so that we can review the results together.   Baltazar Apo, MD, PhD 07/08/2021, 12:33 PM [facility] [phone] or if no answer before 7:00PM call [phone] For any issues after 7:00PM please call eLink [phone]

## 2021-07-08 NOTE — Progress Notes (Signed)
Subjective:  Patient ID: Delaila Nand. Vincent, female    DOB: 1951/08/15  Age: 70 y.o. MRN: [id number]  CC: Follow-up (6 WEEK F/U)   HPI Lylla Eifler. Capobianco presents for cough - not better, abn CT - 2.8 x 1.0 cm irregular density is noted anteriorly in the left lower lobe adjacent to left major fissure   C/o sinusitis sx's - green d/c, blood. F/u on face lesions  BP - nl at home  Outpatient Medications Prior to Visit  Medication Sig Dispense Refill   benzonatate (TESSALON) 200 MG capsule Take 1 capsule (200 mg total) by mouth 3 (three) times daily as needed for cough. (Patient not taking: No sig reported) 60 capsule 1   Cholecalciferol (VITAMIN D3) 2000 units capsule Take 1 capsule (2,000 Units total) by mouth daily. (Patient not taking: Reported on 07/08/2021) 100 capsule 3   No facility-administered medications prior to visit.    ROS: Review of Systems  Constitutional:  Negative for activity change, appetite change, chills, fatigue and unexpected weight change.  HENT:  Positive for congestion, sinus pressure and sinus pain. Negative for mouth sores.   Eyes:  Negative for visual disturbance.  Respiratory:  Positive for cough. Negative for chest tightness.   Gastrointestinal:  Negative for abdominal pain and nausea.  Genitourinary:  Negative for difficulty urinating, frequency and vaginal pain.  Musculoskeletal:  Negative for back pain and gait problem.  Skin:  Negative for pallor and rash.  Neurological:  Negative for dizziness, tremors, weakness, numbness and headaches.  Psychiatric/Behavioral:  Positive for sleep disturbance. Negative for confusion.    Objective:  BP (!) 154/82 (BP Location: Left Arm)   Pulse 86   Temp 98 F (36.7 C) (Oral)   Ht 5\' 4"  (1.626 m)   Wt 173 lb 3.2 oz (78.6 kg)   LMP  (LMP Unknown)   SpO2 97%   BMI 29.73 kg/m   BP Readings from Last 3 Encounters:  07/08/21 (!) 154/82  06/24/21 (!) 145/90  05/28/21 (!) 158/80    Wt Readings from Last 3  Encounters:  07/08/21 173 lb 3.2 oz (78.6 kg)  06/24/21 174 lb 7.2 oz (79.1 kg)  05/28/21 175 lb 9.6 oz (79.7 kg)    Physical Exam Constitutional:      General: She is not in acute distress.    Appearance: She is well-developed.  HENT:     Head: Normocephalic.     Right Ear: External ear normal.     Left Ear: External ear normal.     Nose: Nose normal.  Eyes:     General:        Right eye: No discharge.        Left eye: No discharge.     Conjunctiva/sclera: Conjunctivae normal.     Pupils: Pupils are equal, round, and reactive to light.  Neck:     Thyroid: No thyromegaly.     Vascular: No JVD.     Trachea: No tracheal deviation.  Cardiovascular:     Rate and Rhythm: Normal rate and regular rhythm.     Heart sounds: Normal heart sounds.  Pulmonary:     Effort: No respiratory distress.     Breath sounds: No stridor. No wheezing.  Abdominal:     General: Bowel sounds are normal. There is no distension.     Palpations: Abdomen is soft. There is no mass.     Tenderness: There is no abdominal tenderness. There is no guarding or rebound.  Musculoskeletal:  General: No tenderness.     Cervical back: Normal range of motion and neck supple. No rigidity.  Lymphadenopathy:     Cervical: No cervical adenopathy.  Skin:    Findings: No erythema or rash.  Neurological:     Cranial Nerves: No cranial nerve deficit.     Motor: No abnormal muscle tone.     Coordination: Coordination normal.     Deep Tendon Reflexes: Reflexes normal.  Psychiatric:        Behavior: Behavior normal.        Thought Content: Thought content normal.        Judgment: Judgment normal.  Coughing AKs on face   Procedure Note :     Procedure : Cryosurgery   Indication:  Actinic keratosis(es)   Risks including unsuccessful procedure , bleeding, infection, bruising, scar, a need for a repeat  procedure and others were explained to the patient in detail as well as the benefits. Informed consent was  obtained verbally.    9 lesion(s)  on  face  was/were treated with liquid nitrogen on a Q-tip in a usual fasion . Band-Aid was applied and antibiotic ointment was given for a later use.   Tolerated well. Complications none.   Postprocedure instructions :     Keep the wounds clean. You can wash them with liquid soap and water. Pat dry with gauze or a Kleenex tissue  Before applying antibiotic ointment and a Band-Aid.   You need to report immediately  if  any signs of infection develop.    Lab Results  Component Value Date   WBC 7.7 06/23/2020   HGB 12.9 06/23/2020   HCT 39.4 06/23/2020   PLT 287.0 06/23/2020   GLUCOSE 92 07/18/2020   CHOL 165 06/23/2020   TRIG 70.0 06/23/2020   HDL 58.90 06/23/2020   LDLDIRECT 139.0 04/07/2009   LDLCALC 92 06/23/2020   ALT 14 07/18/2020   AST 19 07/18/2020   NA 137 07/18/2020   K 4.3 07/18/2020   CL 100 07/18/2020   CREATININE 0.75 07/18/2020   BUN 13 07/18/2020   CO2 30 07/18/2020   TSH 2.18 07/18/2020    NM PET Image Initial (PI) Skull Base To Thigh (F-18 FDG)  Result Date: 07/03/2021 CLINICAL DATA:  Initial treatment strategy for pulmonary nodules. EXAM: NUCLEAR MEDICINE PET SKULL BASE TO THIGH TECHNIQUE: 8 point sick mCi F-18 FDG was injected intravenously. Full-ring PET imaging was performed from the skull base to thigh after the radiotracer. CT data was obtained and used for attenuation correction and anatomic localization. Fasting blood glucose: 114 mg/dl COMPARISON:  CT chest 06/11/2021 FINDINGS: Mediastinal blood pool activity: SUV max 2.6 Liver activity: SUV max NA NECK: Symmetric tonsillar and glottic activity, likely physiologic. No pathologic adenopathy identified. Incidental CT findings: Chronic ethmoid and bilateral maxillary sinusitis. CHEST: Persistent somewhat curved nodularity peripherally in the left lower lobe measuring 2.5 by 1.3 cm, maximum SUV 2.1. Activity in the left supraspinatus muscle without CT correlate, maximum SUV  3.6, likely physiologic/incidental. No hypermetabolic or pathologic adenopathy observed. Incidental CT findings: Air-level in the esophagus, nonspecific but dysmotility is not excluded. Mild scarring or atelectasis in both lower lobes. ABDOMEN/PELVIS: Mildly accentuated activity in the right colon is likely physiologic given the lack of CT correlate. Incidental CT findings: Cholecystectomy. SKELETON: No significant abnormal hypermetabolic activity in this region. Incidental CT findings: Thoracic kyphosis and mildly exaggerated lumbar lordosis. IMPRESSION: 1. Stable appearance of the peripheral curved left lower lobe nodule with maximum SUV of  2.1. Possibilities include benign bronchocele with local inflammation, versus low-grade malignancy. If tissue diagnosis is not elected, I would recommend CT imaging of the chest in 3-6 months time for initial re-evaluation. 2. Chronic ethmoid and bilateral maxillary sinusitis. 3. Air fluid level in the esophagus, nonspecific but dysmotility is not excluded. Electronically Signed   By: Van Clines M.D.   On: 07/03/2021 07:32    Assessment & Plan:   Problem List Items Addressed This Visit     Abnormal CT of the chest    We looked at PET scan report - discussed Appt w/Dr Lamonte Sakai later today      Actinic keratosis    See cryo      Acute sinusitis    New Cefdinir x 10 d and Diflucan      Relevant Medications   cefdinir (OMNICEF) 300 MG capsule   fluconazole (DIFLUCAN) 150 MG tablet   Cough    Appt w/Dr Lamonte Sakai today Tessalon Cough syr prn         Follow-up: Return in about 3 months (around 10/08/2021) for a follow-up visit.  Walker Kehr, MD

## 2021-07-08 NOTE — Assessment & Plan Note (Signed)
See cryo 

## 2021-07-08 NOTE — Assessment & Plan Note (Signed)
Her left lower lobe pulmonary nodules unchanged in size or appearance.  No hypermetabolism on PET scan.  Reviewed this with her today.  I still have some concern about the fact that it appears to have evolved going back to 06/2019.  We will continue to follow it, next scan in 6 months.  If there is an interval change we will talk about strategies for either biopsy or resection.  We reviewed your PET scan and your pulmonary function testing today. We will plan to repeat your CT scan of the chest in April 2023.   Follow with Dr. Lamonte Sakai in April after your CT scan so that we can review the results together.

## 2021-07-08 NOTE — Addendum Note (Signed)
Addended by: Gavin Potters R on: 07/08/2021 12:40 PM   Modules accepted: Orders

## 2021-07-08 NOTE — Patient Instructions (Signed)
We reviewed your PET scan and your pulmonary function testing today. We will plan to repeat your CT scan of the chest in April 2023.   Follow with Dr. Lamonte Sakai in April after your CT scan so that we can review the results together.

## 2021-07-08 NOTE — Assessment & Plan Note (Signed)
We looked at PET scan report - discussed Appt w/Dr Lamonte Sakai later today

## 2021-07-09 ENCOUNTER — Ambulatory Visit: Payer: Medicare Other | Admitting: Internal Medicine

## 2021-07-15 ENCOUNTER — Other Ambulatory Visit: Payer: Self-pay | Admitting: Internal Medicine

## 2021-07-28 ENCOUNTER — Telehealth: Payer: Self-pay | Admitting: Internal Medicine

## 2021-07-28 NOTE — Telephone Encounter (Signed)
Patient says she has been experiencing food poisoning for a couple of days now & would like for provider to send rx for ciprofloxacin (CIPRO) 500 MG tablet & fluconazole (DIFLUCAN) 150 MG tablet to pharmacy  Pharmacy:  [id number] [address], [place]  Phone:  [phone] Fax:  [phone]  Advised patient OV may be needed

## 2021-07-28 NOTE — Telephone Encounter (Signed)
We do not use Cipro and Diflucan for food poisoning.  Stay on clear liquids.  Advance to Jell-O, broth, simple food as tolerated.  Use Imodium A-D for diarrhea.  Office visit with any provider if not better.  Thanks

## 2021-07-29 ENCOUNTER — Ambulatory Visit: Payer: Medicare Other | Admitting: Nurse Practitioner

## 2021-07-29 NOTE — Telephone Encounter (Signed)
Notified pt w/ MD response. Pt states she is seeing MD tomorrow morning anyway concerning her abdomen.Marland KitchenAndee Flores

## 2021-07-30 ENCOUNTER — Ambulatory Visit (INDEPENDENT_AMBULATORY_CARE_PROVIDER_SITE_OTHER): Payer: Medicare Other | Admitting: Internal Medicine

## 2021-07-30 ENCOUNTER — Other Ambulatory Visit: Payer: Self-pay

## 2021-07-30 ENCOUNTER — Encounter: Payer: Self-pay | Admitting: Internal Medicine

## 2021-07-30 ENCOUNTER — Other Ambulatory Visit: Payer: Medicare Other

## 2021-07-30 DIAGNOSIS — R053 Chronic cough: Secondary | ICD-10-CM

## 2021-07-30 DIAGNOSIS — K529 Noninfective gastroenteritis and colitis, unspecified: Secondary | ICD-10-CM

## 2021-07-30 DIAGNOSIS — R9389 Abnormal findings on diagnostic imaging of other specified body structures: Secondary | ICD-10-CM

## 2021-07-30 LAB — CBC WITH DIFFERENTIAL/PLATELET
Basophils Absolute: 0 10*3/uL (ref 0.0–0.1)
Basophils Relative: 0.3 % (ref 0.0–3.0)
Eosinophils Absolute: 0.2 10*3/uL (ref 0.0–0.7)
Eosinophils Relative: 2.7 % (ref 0.0–5.0)
HCT: 39.4 % (ref 36.0–46.0)
Hemoglobin: 13.1 g/dL (ref 12.0–15.0)
Lymphocytes Relative: 26.8 % (ref 12.0–46.0)
Lymphs Abs: 1.8 10*3/uL (ref 0.7–4.0)
MCHC: 33.1 g/dL (ref 30.0–36.0)
MCV: 89.1 fl (ref 78.0–100.0)
Monocytes Absolute: 0.6 10*3/uL (ref 0.1–1.0)
Monocytes Relative: 8.3 % (ref 3.0–12.0)
Neutro Abs: 4.2 10*3/uL (ref 1.4–7.7)
Neutrophils Relative %: 61.9 % (ref 43.0–77.0)
Platelets: 280 10*3/uL (ref 150.0–400.0)
RBC: 4.42 Mil/uL (ref 3.87–5.11)
RDW: 12.8 % (ref 11.5–15.5)
WBC: 6.8 10*3/uL (ref 4.0–10.5)

## 2021-07-30 LAB — COMPREHENSIVE METABOLIC PANEL
ALT: 12 U/L (ref 0–35)
AST: 13 U/L (ref 0–37)
Albumin: 4.2 g/dL (ref 3.5–5.2)
Alkaline Phosphatase: 66 U/L (ref 39–117)
BUN: 9 mg/dL (ref 6–23)
CO2: 27 mEq/L (ref 19–32)
Calcium: 9.4 mg/dL (ref 8.4–10.5)
Chloride: 104 mEq/L (ref 96–112)
Creatinine, Ser: 0.76 mg/dL (ref 0.40–1.20)
GFR: 80.17 mL/min (ref >60.00)
Glucose, Bld: 99 mg/dL (ref 70–99)
Potassium: 3.8 mEq/L (ref 3.5–5.1)
Sodium: 141 mEq/L (ref 135–145)
Total Bilirubin: 0.5 mg/dL (ref 0.2–1.2)
Total Protein: 7.2 g/dL (ref 6.0–8.3)

## 2021-07-30 LAB — SEDIMENTATION RATE: Sed Rate: 39 mm/hr — ABNORMAL HIGH (ref 0–30)

## 2021-07-30 MED ORDER — FAMOTIDINE 40 MG PO TABS: 40.0000 mg | ORAL_TABLET | Freq: Every day | ORAL | 3 refills | Status: DC

## 2021-07-30 MED ORDER — FLUCONAZOLE 150 MG PO TABS: 150.0000 mg | ORAL_TABLET | Freq: Once | ORAL | 1 refills | Status: AC

## 2021-07-30 MED ORDER — CIPROFLOXACIN HCL 250 MG PO TABS: 250.0000 mg | ORAL_TABLET | Freq: Two times a day (BID) | ORAL | 0 refills | Status: DC

## 2021-07-30 MED ORDER — DIPHENOXYLATE-ATROPINE 2.5-0.025 MG PO TABS: 1.0000 | ORAL_TABLET | Freq: Four times a day (QID) | ORAL | 0 refills | Status: DC | PRN

## 2021-07-30 NOTE — Progress Notes (Signed)
Subjective:  Patient ID: [name]. [name], female    DOB: [birth year]  Age: 70 y.o. MRN: [id number]  CC: Abdominal Pain   HPI Katrina Hunn. Flores presents for diarrhea and abd discomfort x 10 d likely after eating bad eggs. Still having 10 loose stools a day plus night now. On well water  Outpatient Medications Prior to Visit  Medication Sig Dispense Refill   Cholecalciferol (VITAMIN D3) 2000 units capsule Take 1 capsule (2,000 Units total) by mouth daily. 100 capsule 3   benzonatate (TESSALON) 200 MG capsule Take 1 capsule (200 mg total) by mouth 3 (three) times daily as needed for cough. (Patient not taking: No sig reported) 60 capsule 1   cefdinir (OMNICEF) 300 MG capsule TAKE 1 CAPSULE BY MOUTH TWICE A DAY (Patient not taking: Reported on 07/30/2021) 20 capsule 0   No facility-administered medications prior to visit.    ROS: Review of Systems  Constitutional:  Negative for activity change, appetite change, chills, fatigue and unexpected weight change.  HENT:  Negative for congestion, mouth sores and sinus pressure.   Eyes:  Negative for visual disturbance.  Respiratory:  Negative for cough and chest tightness.   Gastrointestinal:  Positive for abdominal pain and diarrhea. Negative for nausea.  Genitourinary:  Negative for difficulty urinating, frequency and vaginal pain.  Musculoskeletal:  Negative for back pain and gait problem.  Skin:  Negative for pallor and rash.  Neurological:  Negative for dizziness, tremors, weakness, numbness and headaches.  Psychiatric/Behavioral:  Negative for confusion and sleep disturbance.    Objective:  BP (!) 142/82 (BP Location: Left Arm)   Pulse 86   Temp 98 F (36.7 C) (Oral)   Ht 5\' 5"  (1.651 m)   Wt 169 lb 9.6 oz (76.9 kg)   LMP  (LMP Unknown)   SpO2 98%   BMI 28.22 kg/m   BP Readings from Last 3 Encounters:  07/30/21 (!) 142/82  07/08/21 130/78  07/08/21 (!) 154/82    Wt Readings from Last 3 Encounters:  07/30/21 169 lb 9.6  oz (76.9 kg)  07/08/21 172 lb 3.2 oz (78.1 kg)  07/08/21 173 lb 3.2 oz (78.6 kg)    Physical Exam Constitutional:      General: She is not in acute distress.    Appearance: She is well-developed.  HENT:     Head: Normocephalic.     Right Ear: External ear normal.     Left Ear: External ear normal.     Nose: Nose normal.  Eyes:     General:        Right eye: No discharge.        Left eye: No discharge.     Conjunctiva/sclera: Conjunctivae normal.     Pupils: Pupils are equal, round, and reactive to light.  Neck:     Thyroid: No thyromegaly.     Vascular: No JVD.     Trachea: No tracheal deviation.  Cardiovascular:     Rate and Rhythm: Normal rate and regular rhythm.     Heart sounds: Normal heart sounds.  Pulmonary:     Effort: No respiratory distress.     Breath sounds: No stridor. No wheezing.  Abdominal:     General: Bowel sounds are normal. There is no distension.     Palpations: Abdomen is soft. There is no mass.     Tenderness: There is no abdominal tenderness. There is no guarding or rebound.  Musculoskeletal:        General: No  tenderness.     Cervical back: Normal range of motion and neck supple. No rigidity.  Lymphadenopathy:     Cervical: No cervical adenopathy.  Skin:    Findings: No erythema or rash.  Neurological:     Cranial Nerves: No cranial nerve deficit.     Motor: No abnormal muscle tone.     Coordination: Coordination normal.     Deep Tendon Reflexes: Reflexes normal.  Psychiatric:        Behavior: Behavior normal.        Thought Content: Thought content normal.        Judgment: Judgment normal.  NAD  Lab Results  Component Value Date   WBC 7.7 06/23/2020   HGB 12.9 06/23/2020   HCT 39.4 06/23/2020   PLT 287.0 06/23/2020   GLUCOSE 92 07/18/2020   CHOL 165 06/23/2020   TRIG 70.0 06/23/2020   HDL 58.90 06/23/2020   LDLDIRECT 139.0 04/07/2009   LDLCALC 92 06/23/2020   ALT 14 07/18/2020   AST 19 07/18/2020   NA 137 07/18/2020   K 4.3  07/18/2020   CL 100 07/18/2020   CREATININE 0.75 07/18/2020   BUN 13 07/18/2020   CO2 30 07/18/2020   TSH 2.18 07/18/2020    NM PET Image Initial (PI) Skull Base To Thigh (F-18 FDG)  Result Date: 07/03/2021 CLINICAL DATA:  Initial treatment strategy for pulmonary nodules. EXAM: NUCLEAR MEDICINE PET SKULL BASE TO THIGH TECHNIQUE: 8 point sick mCi F-18 FDG was injected intravenously. Full-ring PET imaging was performed from the skull base to thigh after the radiotracer. CT data was obtained and used for attenuation correction and anatomic localization. Fasting blood glucose: 114 mg/dl COMPARISON:  CT chest 06/11/2021 FINDINGS: Mediastinal blood pool activity: SUV max 2.6 Liver activity: SUV max NA NECK: Symmetric tonsillar and glottic activity, likely physiologic. No pathologic adenopathy identified. Incidental CT findings: Chronic ethmoid and bilateral maxillary sinusitis. CHEST: Persistent somewhat curved nodularity peripherally in the left lower lobe measuring 2.5 by 1.3 cm, maximum SUV 2.1. Activity in the left supraspinatus muscle without CT correlate, maximum SUV 3.6, likely physiologic/incidental. No hypermetabolic or pathologic adenopathy observed. Incidental CT findings: Air-level in the esophagus, nonspecific but dysmotility is not excluded. Mild scarring or atelectasis in both lower lobes. ABDOMEN/PELVIS: Mildly accentuated activity in the right colon is likely physiologic given the lack of CT correlate. Incidental CT findings: Cholecystectomy. SKELETON: No significant abnormal hypermetabolic activity in this region. Incidental CT findings: Thoracic kyphosis and mildly exaggerated lumbar lordosis. IMPRESSION: 1. Stable appearance of the peripheral curved left lower lobe nodule with maximum SUV of 2.1. Possibilities include benign bronchocele with local inflammation, versus low-grade malignancy. If tissue diagnosis is not elected, I would recommend CT imaging of the chest in 3-6 months time for  initial re-evaluation. 2. Chronic ethmoid and bilateral maxillary sinusitis. 3. Air fluid level in the esophagus, nonspecific but dysmotility is not excluded. Electronically Signed   By: Van Clines M.D.   On: 07/03/2021 07:32    Assessment & Plan:   Problem List Items Addressed This Visit     Abnormal CT of the chest    Dr Lamonte Sakai is planning to see [name] back in 6 months. Notes, tests reviewed  PET CT IMPRESSION: 1. Stable appearance of the peripheral curved left lower lobe nodule with maximum SUV of 2.1. Possibilities include benign bronchocele with local inflammation, versus low-grade malignancy. If tissue diagnosis is not elected, I would recommend CT imaging of the chest in 3-6 months time for initial  re-evaluation. 2. Chronic ethmoid and bilateral maxillary sinusitis. 3. Air fluid level in the esophagus, nonspecific but dysmotility is not excluded.     Electronically Signed   By: Van Clines M.D.   On: 07/03/2021 07:32         Cough    Cough is better      Gastroenteritis    New, refractory Start Pepcid, Lomotil. Start Cipro if C diff test is negative Stool tests C diff, stool PCR, Giard/crypto      Relevant Orders   CBC with Differential/Platelet   Comprehensive metabolic panel   Sedimentation rate   Clostridium difficile EIA   GI Profile, Stool, PCR   Giardia/cryptosporidium (EIA)      Meds ordered this encounter  Medications   famotidine (PEPCID) 40 MG tablet    Sig: Take 1 tablet (40 mg total) by mouth daily.    Dispense:  90 tablet    Refill:  3   diphenoxylate-atropine (LOMOTIL) 2.5-0.025 MG tablet    Sig: Take 1 tablet by mouth 4 (four) times daily as needed for diarrhea or loose stools.    Dispense:  60 tablet    Refill:  0   ciprofloxacin (CIPRO) 250 MG tablet    Sig: Take 1 tablet (250 mg total) by mouth 2 (two) times daily.    Dispense:  10 tablet    Refill:  0   fluconazole (DIFLUCAN) 150 MG tablet    Sig: Take 1 tablet (150  mg total) by mouth once for 1 dose.    Dispense:  1 tablet    Refill:  1      Follow-up: Return in about 4 weeks (around 08/27/2021) for a follow-up visit.  Walker Kehr, MD

## 2021-07-30 NOTE — Assessment & Plan Note (Addendum)
Dr Lamonte Sakai is planning to see [name] back in 6 months. Notes, tests reviewed  PET CT IMPRESSION: 1. Stable appearance of the peripheral curved left lower lobe nodule with maximum SUV of 2.1. Possibilities include benign bronchocele with local inflammation, versus low-grade malignancy. If tissue diagnosis is not elected, I would recommend CT imaging of the chest in 3-6 months time for initial re-evaluation. 2. Chronic ethmoid and bilateral maxillary sinusitis. 3. Air fluid level in the esophagus, nonspecific but dysmotility is not excluded.   Electronically Signed   By: Van Clines M.D.   On: 07/03/2021 07:32

## 2021-07-30 NOTE — Patient Instructions (Signed)
Start Cipro if C diff test is negative

## 2021-07-30 NOTE — Assessment & Plan Note (Signed)
Cough is better

## 2021-07-30 NOTE — Assessment & Plan Note (Addendum)
New, refractory Start Pepcid, Lomotil. Start Cipro if C diff test is negative Stool tests C diff, stool PCR, Giard/crypto

## 2021-07-31 LAB — GIARDIA AND CRYPTOSPORIDIUM ANTIGEN PANEL
MICRO NUMBER:: 12589812
RESULT:: NOT DETECTED
SPECIMEN QUALITY:: ADEQUATE
Specimen Quality:: ADEQUATE
micro Number:: 12589811

## 2021-08-01 ENCOUNTER — Other Ambulatory Visit: Payer: Self-pay | Admitting: Internal Medicine

## 2021-08-01 LAB — CLOSTRIDIUM DIFFICILE EIA: C difficile Toxins A+B, EIA: POSITIVE — AB

## 2021-08-01 MED ORDER — METRONIDAZOLE 500 MG PO TABS: 500.0000 mg | ORAL_TABLET | Freq: Three times a day (TID) | ORAL | 0 refills | Status: DC

## 2021-08-01 NOTE — Progress Notes (Signed)
Flagyl

## 2021-08-03 ENCOUNTER — Telehealth: Payer: Self-pay

## 2021-08-03 LAB — GI PROFILE, STOOL, PCR
Adenovirus F 40/41: NOT DETECTED
Astrovirus: NOT DETECTED
C difficile toxin A/B: DETECTED — AB
Campylobacter: NOT DETECTED
Cryptosporidium: NOT DETECTED
Cyclospora cayetanensis: NOT DETECTED
Entamoeba histolytica: NOT DETECTED
Enteroaggregative E coli: NOT DETECTED
Enteropathogenic E coli: NOT DETECTED
Enterotoxigenic E coli: NOT DETECTED
Giardia lamblia: NOT DETECTED
Norovirus GI/GII: NOT DETECTED
Plesiomonas shigelloides: NOT DETECTED
Rotavirus A: NOT DETECTED
Salmonella: NOT DETECTED
Sapovirus: DETECTED — AB
Shiga-toxin-producing E coli: NOT DETECTED
Shigella/Enteroinvasive E coli: NOT DETECTED
Vibrio cholerae: NOT DETECTED
Vibrio: NOT DETECTED
Yersinia enterocolitica: NOT DETECTED

## 2021-08-03 NOTE — Telephone Encounter (Signed)
Received a call from [place] with [facility] in regards to DETECTED C. Difficile Toxin A/B and Sapovirus. Advised that this was ordered by Dr. Alain Marion and it looks like it has already been addressed.

## 2021-08-11 ENCOUNTER — Telehealth: Payer: Self-pay | Admitting: Internal Medicine

## 2021-08-11 NOTE — Telephone Encounter (Signed)
Patient states she is still having diarrhea after completing prescribed medication and following instructed diet  Patient is requesting a call back to discuss symptoms

## 2021-08-12 ENCOUNTER — Other Ambulatory Visit: Payer: Self-pay

## 2021-08-12 ENCOUNTER — Encounter: Payer: Self-pay | Admitting: Internal Medicine

## 2021-08-12 ENCOUNTER — Ambulatory Visit (INDEPENDENT_AMBULATORY_CARE_PROVIDER_SITE_OTHER): Payer: Medicare Other | Admitting: Internal Medicine

## 2021-08-12 VITALS — BP 142/80 | HR 91 | Temp 98.0°F | Ht 65.0 in | Wt 166.8 lb

## 2021-08-12 DIAGNOSIS — A0472 Enterocolitis due to Clostridium difficile, not specified as recurrent: Secondary | ICD-10-CM

## 2021-08-12 MED ORDER — VANCOMYCIN HCL 125 MG PO CAPS: 125.0000 mg | ORAL_CAPSULE | Freq: Four times a day (QID) | ORAL | 0 refills | Status: AC

## 2021-08-12 MED ORDER — SACCHAROMYCES BOULARDII 250 MG PO CAPS: 250.0000 mg | ORAL_CAPSULE | Freq: Two times a day (BID) | ORAL | 1 refills | Status: DC

## 2021-08-12 NOTE — Telephone Encounter (Signed)
Called pt back she came in today to see Dr. Quay Burow. Was given vancomycin. Inform pt she still can take the probiotic to help with sxs and for her stomach.Marland KitchenJohny Flores

## 2021-08-12 NOTE — Progress Notes (Signed)
Subjective:    Patient ID: [name], female    DOB: [birth year], 70 y.o.   MRN: [id number]  This visit occurred during the SARS-CoV-2 public health emergency.  Safety protocols were in place, including screening questions prior to the visit, additional usage of staff PPE, and extensive cleaning of exam room while observing appropriate contact time as indicated for disinfecting solutions.    HPI The patient is here for an acute visit.   Diarrhea - she had several rounds of antibiotics and started having diarrhea the end of October.  She had a stool cx on 11/3 and it showed Cdiff and sapovirus.   She was put on lomotil and flagyl.  The diarrhea has improved minimally, but she is still having diarrhea 10 times a day or more. She has occasional low grade fever up to 100.  She has mucus in her stool and occasional blood.  She denies abdominal cramping/pain, nausea.  She was taking imodium, but stopped taking it.  She has been eating mostly a liquid diet.    She also states bumps on her labia.  They have been there intermittently for months. They are not painful and has seen her gyn and he was not concerned. Advised gyn f/u.   Medications and allergies reviewed with patient and updated if appropriate.  Patient Active Problem List   Diagnosis Date Noted   Gastroenteritis 07/30/2021   Acute sinusitis 07/08/2021   Abnormal CT of the chest 06/24/2021   Loss of taste 05/28/2021   Viral respiratory infection 01/22/2021   Cough 12/31/2020   Acute bronchitis 12/31/2020   Abnormal TSH 06/25/2020   Seborrheic dermatitis of scalp 12/22/2015   Travel advice encounter 05/21/2015   Well adult exam 06/20/2012   Actinic keratosis 07/26/2007   KERATOSIS, SEBORRHEIC NEC 07/26/2007    Current Outpatient Medications on File Prior to Visit  Medication Sig Dispense Refill   Cholecalciferol (VITAMIN D3) 2000 units capsule Take 1 capsule (2,000 Units total) by mouth daily. 100 capsule 3    saccharomyces boulardii (FLORASTOR) 250 MG capsule Take 1 capsule (250 mg total) by mouth 2 (two) times daily. 60 capsule 1   ciprofloxacin (CIPRO) 250 MG tablet Take 1 tablet (250 mg total) by mouth 2 (two) times daily. (Patient not taking: Reported on 08/12/2021) 10 tablet 0   diphenoxylate-atropine (LOMOTIL) 2.5-0.025 MG tablet Take 1 tablet by mouth 4 (four) times daily as needed for diarrhea or loose stools. (Patient not taking: Reported on 08/12/2021) 60 tablet 0   famotidine (PEPCID) 40 MG tablet Take 1 tablet (40 mg total) by mouth daily. (Patient not taking: Reported on 08/12/2021) 90 tablet 3   metroNIDAZOLE (FLAGYL) 500 MG tablet Take 1 tablet (500 mg total) by mouth 3 (three) times daily. (Patient not taking: Reported on 08/12/2021) 30 tablet 0   No current facility-administered medications on file prior to visit.    Past Medical History:  Diagnosis Date   Anemia    with a pregnancy    GERD (gastroesophageal reflux disease)    pt denies this dx    SVD (spontaneous vaginal delivery)    x 5    Past Surgical History:  Procedure Laterality Date   APPENDECTOMY     CHOLECYSTECTOMY     COLONOSCOPY  08/23/2006   DB- normal    DIRECT LARYNGOSCOPY N/A 01/14/2019   Procedure: DIRECT LARYNGOSCOPY;  Surgeon: Melida Quitter, MD;  Location: [facility];  Service: ENT;  Laterality: N/A;   FOREIGN BODY REMOVAL  ESOPHAGEAL  01/14/2019   Procedure: Removal Foreign Body Esophageal;  Surgeon: Melida Quitter, MD;  Location: [facility] OR;  Service: ENT;;   TONSILLECTOMY      Social History   Socioeconomic History   Marital status: Married    Spouse name: Not on file   Number of children: 5   Years of education: Not on file   Highest education level: Not on file  Occupational History   Not on file  Tobacco Use   Smoking status: Never   Smokeless tobacco: Never  Vaping Use   Vaping Use: Never used  Substance and Sexual Activity   Alcohol use: No    Alcohol/week: 0.0 standard drinks   Drug use: No    Sexual activity: Yes    Birth control/protection: Post-menopausal  Other Topics Concern   Not on file  Social History Narrative   Not on file   Social Determinants of Health   Financial Resource Strain: Not on file  Food Insecurity: Not on file  Transportation Needs: Not on file  Physical Activity: Not on file  Stress: Not on file  Social Connections: Not on file    Family History  Problem Relation Age of Onset   Aneurysm Father        AAA   Cancer Brother 19       eye melanoma   Colon cancer Neg Hx    Esophageal cancer Neg Hx    Rectal cancer Neg Hx    Stomach cancer Neg Hx    Pancreatic cancer Neg Hx     Review of Systems     Objective:   Vitals:   08/12/21 0938  BP: (!) 142/80  Pulse: 91  Temp: 98 F (36.7 C)  SpO2: 98%   BP Readings from Last 3 Encounters:  08/12/21 (!) 142/80  07/30/21 (!) 142/82  07/08/21 130/78   Wt Readings from Last 3 Encounters:  08/12/21 166 lb 12.8 oz (75.7 kg)  07/30/21 169 lb 9.6 oz (76.9 kg)  07/08/21 172 lb 3.2 oz (78.1 kg)   Body mass index is 27.76 kg/m.   Physical Exam Constitutional:      General: She is not in acute distress.    Appearance: Normal appearance. She is not ill-appearing.  HENT:     Head: Normocephalic and atraumatic.  Abdominal:     General: There is no distension.     Palpations: Abdomen is soft.     Tenderness: There is abdominal tenderness (minimal left sided tenderness). There is no guarding or rebound.  Skin:    General: Skin is warm and dry.  Neurological:     Mental Status: She is alert.           Assessment & Plan:    C diff diarrhea: Subacute Related to antibiotic use Stool culture from 11/3 - positive for C diff and sapovirus Advised no imodium/lomotil Improved minimally with flagyl 500 mg TID x 10 days Start vaco 125 mg Q 6 hrs x 10 days Discussed that this is contagious - she is taking precautions at home Increase liquids, bland diet Advised her to let us know if  there is no improvement or diarrhea does not resolve completely

## 2021-08-12 NOTE — Telephone Encounter (Signed)
OV or VOV today or tomorrow pls if not much better. Take Florastor OTC bid Thx

## 2021-08-12 NOTE — Patient Instructions (Addendum)
     Medications changes include :   vanocmycin 125 mg 4 times a day for 10 day   Your prescription(s) have been submitted to your pharmacy. Please take as directed and contact our office if you believe you are having problem(s) with the medication(s).

## 2021-08-21 ENCOUNTER — Other Ambulatory Visit: Payer: Self-pay | Admitting: Internal Medicine

## 2021-08-27 ENCOUNTER — Encounter: Payer: Self-pay | Admitting: Internal Medicine

## 2021-08-27 ENCOUNTER — Other Ambulatory Visit: Payer: Self-pay

## 2021-08-27 ENCOUNTER — Ambulatory Visit (INDEPENDENT_AMBULATORY_CARE_PROVIDER_SITE_OTHER): Payer: Medicare Other | Admitting: Internal Medicine

## 2021-08-27 DIAGNOSIS — A0472 Enterocolitis due to Clostridium difficile, not specified as recurrent: Secondary | ICD-10-CM

## 2021-08-27 DIAGNOSIS — R432 Parageusia: Secondary | ICD-10-CM | POA: Diagnosis not present

## 2021-08-27 DIAGNOSIS — L853 Xerosis cutis: Secondary | ICD-10-CM

## 2021-08-27 DIAGNOSIS — R053 Chronic cough: Secondary | ICD-10-CM

## 2021-08-27 MED ORDER — VANCOMYCIN HCL 125 MG PO CAPS: ORAL_CAPSULE | ORAL | 0 refills | Status: DC

## 2021-08-27 MED ORDER — SACCHAROMYCES BOULARDII 250 MG PO CAPS: 250.0000 mg | ORAL_CAPSULE | Freq: Two times a day (BID) | ORAL | 1 refills | Status: DC

## 2021-08-27 NOTE — Assessment & Plan Note (Addendum)
C diff colitis - was on Flagyl, better on Vanco x 10 d, now is relapsing again: 35 watery stools a day now...  Relapse Re-start Vancomycin: 125 mg po qid x 2 weeks, then 125 mg po bid x 1 week, then 125 mg po 3 times a week x 4 weeks; then stop. (# 82). Cont Florastor

## 2021-08-27 NOTE — Assessment & Plan Note (Signed)
Better after Flagyl Rx

## 2021-08-27 NOTE — Assessment & Plan Note (Signed)
Worse Use aquaphor

## 2021-08-27 NOTE — Progress Notes (Signed)
Subjective:  Patient ID: [name]. [name], female    DOB: November 29, 1950  Age: 70 y.o. MRN: [id number]  CC: Follow-up (3 week f/u)   HPI [name]. [place] presents for C diff colitis - was on Flagyl, better on Vanco x 10 d, now is relapsing again: 5 watery stools a day now... F/u on cough, loss of taste  Outpatient Medications Prior to Visit  Medication Sig Dispense Refill   Cholecalciferol (VITAMIN D3) 2000 units capsule Take 1 capsule (2,000 Units total) by mouth daily. 100 capsule 3   saccharomyces boulardii (FLORASTOR) 250 MG capsule Take 1 capsule (250 mg total) by mouth 2 (two) times daily. 60 capsule 1   No facility-administered medications prior to visit.    ROS: Review of Systems  Constitutional:  Negative for activity change, appetite change, chills, fatigue and unexpected weight change.  HENT:  Negative for congestion, mouth sores and sinus pressure.   Eyes:  Negative for visual disturbance.  Respiratory:  Negative for cough and chest tightness.   Gastrointestinal:  Positive for diarrhea. Negative for abdominal pain and nausea.  Genitourinary:  Negative for difficulty urinating, frequency and vaginal pain.  Musculoskeletal:  Negative for back pain and gait problem.  Skin:  Negative for pallor and rash.  Neurological:  Negative for dizziness, tremors, weakness, numbness and headaches.  Psychiatric/Behavioral:  Negative for confusion and sleep disturbance.    Objective:  BP (!) 141/76 (BP Location: Left Arm)   Pulse 83   Temp 98.3 F (36.8 C) (Oral)   Ht 5\' 5"  (1.651 m)   Wt 166 lb 3.2 oz (75.4 kg)   LMP  (LMP Unknown)   SpO2 97%   BMI 27.66 kg/m   BP Readings from Last 3 Encounters:  08/27/21 (!) 141/76  08/12/21 (!) 142/80  07/30/21 (!) 142/82    Wt Readings from Last 3 Encounters:  08/27/21 166 lb 3.2 oz (75.4 kg)  08/12/21 166 lb 12.8 oz (75.7 kg)  07/30/21 169 lb 9.6 oz (76.9 kg)    Physical Exam Constitutional:      General: She is not in acute  distress.    Appearance: She is well-developed.  HENT:     Head: Normocephalic.     Right Ear: External ear normal.     Left Ear: External ear normal.     Nose: Nose normal.  Eyes:     General:        Right eye: No discharge.        Left eye: No discharge.     Conjunctiva/sclera: Conjunctivae normal.     Pupils: Pupils are equal, round, and reactive to light.  Neck:     Thyroid: No thyromegaly.     Vascular: No JVD.     Trachea: No tracheal deviation.  Cardiovascular:     Rate and Rhythm: Normal rate and regular rhythm.     Heart sounds: Normal heart sounds.  Pulmonary:     Effort: No respiratory distress.     Breath sounds: No stridor. No wheezing.  Abdominal:     General: Bowel sounds are normal. There is no distension.     Palpations: Abdomen is soft. There is no mass.     Tenderness: There is no abdominal tenderness. There is no guarding or rebound.  Musculoskeletal:        General: No tenderness.     Cervical back: Normal range of motion and neck supple. No rigidity.  Lymphadenopathy:     Cervical: No cervical adenopathy.  Skin:    Findings: No erythema or rash.  Neurological:     Cranial Nerves: No cranial nerve deficit.     Motor: No abnormal muscle tone.     Coordination: Coordination normal.     Deep Tendon Reflexes: Reflexes normal.  Psychiatric:        Behavior: Behavior normal.        Thought Content: Thought content normal.        Judgment: Judgment normal.  Dry skin  Lab Results  Component Value Date   WBC 6.8 07/30/2021   HGB 13.1 07/30/2021   HCT 39.4 07/30/2021   PLT 280.0 07/30/2021   GLUCOSE 99 07/30/2021   CHOL 165 06/23/2020   TRIG 70.0 06/23/2020   HDL 58.90 06/23/2020   LDLDIRECT 139.0 04/07/2009   LDLCALC 92 06/23/2020   ALT 12 07/30/2021   AST 13 07/30/2021   NA 141 07/30/2021   K 3.8 07/30/2021   CL 104 07/30/2021   CREATININE 0.76 07/30/2021   BUN 9 07/30/2021   CO2 27 07/30/2021   TSH 2.18 07/18/2020    NM PET Image  Initial (PI) Skull Base To Thigh (F-18 FDG)  Result Date: 07/03/2021 CLINICAL DATA:  Initial treatment strategy for pulmonary nodules. EXAM: NUCLEAR MEDICINE PET SKULL BASE TO THIGH TECHNIQUE: 8 point sick mCi F-18 FDG was injected intravenously. Full-ring PET imaging was performed from the skull base to thigh after the radiotracer. CT data was obtained and used for attenuation correction and anatomic localization. Fasting blood glucose: 114 mg/dl COMPARISON:  CT chest 06/11/2021 FINDINGS: Mediastinal blood pool activity: SUV max 2.6 Liver activity: SUV max NA NECK: Symmetric tonsillar and glottic activity, likely physiologic. No pathologic adenopathy identified. Incidental CT findings: Chronic ethmoid and bilateral maxillary sinusitis. CHEST: Persistent somewhat curved nodularity peripherally in the left lower lobe measuring 2.5 by 1.3 cm, maximum SUV 2.1. Activity in the left supraspinatus muscle without CT correlate, maximum SUV 3.6, likely physiologic/incidental. No hypermetabolic or pathologic adenopathy observed. Incidental CT findings: Air-level in the esophagus, nonspecific but dysmotility is not excluded. Mild scarring or atelectasis in both lower lobes. ABDOMEN/PELVIS: Mildly accentuated activity in the right colon is likely physiologic given the lack of CT correlate. Incidental CT findings: Cholecystectomy. SKELETON: No significant abnormal hypermetabolic activity in this region. Incidental CT findings: Thoracic kyphosis and mildly exaggerated lumbar lordosis. IMPRESSION: 1. Stable appearance of the peripheral curved left lower lobe nodule with maximum SUV of 2.1. Possibilities include benign bronchocele with local inflammation, versus low-grade malignancy. If tissue diagnosis is not elected, I would recommend CT imaging of the chest in 3-6 months time for initial re-evaluation. 2. Chronic ethmoid and bilateral maxillary sinusitis. 3. Air fluid level in the esophagus, nonspecific but dysmotility is not  excluded. Electronically Signed   By: Van Clines M.D.   On: 07/03/2021 07:32    Assessment & Plan:   Problem List Items Addressed This Visit     C. difficile colitis    C diff colitis - was on Flagyl, better on Vanco x 10 d, now is relapsing again: 35 watery stools a day now...  Relapse Re-start Vancomycin: 125 mg po qid x 2 weeks, then 125 mg po bid x 1 week, then 125 mg po 3 times a week x 4 weeks; then stop. (# 82). Cont Florastor      Relevant Medications   vancomycin (VANCOCIN) 125 MG capsule   Cough    Better after Flagyl Rx      Dry skin  Worse Use aquaphor      Loss of taste    Better          Meds ordered this encounter  Medications   vancomycin (VANCOCIN) 125 MG capsule    Sig: Re-start Vancomycin: 125 mg po qid x 2 weeks, then 125 mg po bid x 1 week, then 125 mg po 3 times a week x 4 weeks; then stop.    Dispense:  82 capsule    Refill:  0   saccharomyces boulardii (FLORASTOR) 250 MG capsule    Sig: Take 1 capsule (250 mg total) by mouth 2 (two) times daily.    Dispense:  60 capsule    Refill:  1      Follow-up: Return in about 4 weeks (around 09/24/2021) for a follow-up visit.  Walker Kehr, MD

## 2021-08-27 NOTE — Assessment & Plan Note (Signed)
Better  

## 2021-09-10 ENCOUNTER — Telehealth: Payer: Self-pay | Admitting: Internal Medicine

## 2021-09-10 NOTE — Telephone Encounter (Signed)
Patient calling in  Would like nurse to call her w/ clarifying instructions for medication vancomycin (VANCOCIN) 125 MG capsule  Says she wants to make sure she is taking it as directed  Please call [phone]

## 2021-09-10 NOTE — Telephone Encounter (Signed)
Called pt she is needing to know the instructions of the vancomycin again. The pharmacist did not write on label. Gave her MD instruction for the vancomycin .Marland KitchenJohny Flores

## 2021-09-11 NOTE — Telephone Encounter (Signed)
Patient is still not understanding how she should space out the medication when she starts to taper it off for the 4 weeks. Relayed instructions listed again and she still would  [facility] further clarification. Patient is concerned she will not have enough medication if she follows the instructions advised.   Requesting a callback.

## 2021-09-16 NOTE — Telephone Encounter (Signed)
Patient is still requesting call back about directions on taking medication.. says her questions were still not answered when she spoke to nurse on the 16th   Please call [phone]

## 2021-09-17 ENCOUNTER — Other Ambulatory Visit: Payer: Self-pay | Admitting: Internal Medicine

## 2021-09-17 MED ORDER — VANCOMYCIN HCL 125 MG PO CAPS: ORAL_CAPSULE | ORAL | 0 refills | Status: DC

## 2021-09-17 NOTE — Telephone Encounter (Signed)
[name] was short on Vanc - additional #10 Rx sent

## 2021-09-24 ENCOUNTER — Encounter: Payer: Self-pay | Admitting: Internal Medicine

## 2021-09-24 ENCOUNTER — Ambulatory Visit (INDEPENDENT_AMBULATORY_CARE_PROVIDER_SITE_OTHER): Payer: Medicare Other | Admitting: Internal Medicine

## 2021-09-24 ENCOUNTER — Other Ambulatory Visit: Payer: Self-pay

## 2021-09-24 VITALS — BP 140/78 | HR 79 | Temp 98.3°F | Ht 65.0 in | Wt 166.0 lb

## 2021-09-24 DIAGNOSIS — T148XXA Other injury of unspecified body region, initial encounter: Secondary | ICD-10-CM

## 2021-09-24 DIAGNOSIS — R9389 Abnormal findings on diagnostic imaging of other specified body structures: Secondary | ICD-10-CM | POA: Diagnosis not present

## 2021-09-24 DIAGNOSIS — J322 Chronic ethmoidal sinusitis: Secondary | ICD-10-CM | POA: Diagnosis not present

## 2021-09-24 DIAGNOSIS — J329 Chronic sinusitis, unspecified: Secondary | ICD-10-CM | POA: Insufficient documentation

## 2021-09-24 DIAGNOSIS — A0472 Enterocolitis due to Clostridium difficile, not specified as recurrent: Secondary | ICD-10-CM

## 2021-09-24 LAB — COMPREHENSIVE METABOLIC PANEL
ALT: 17 U/L (ref 0–35)
AST: 18 U/L (ref 0–37)
Albumin: 4.1 g/dL (ref 3.5–5.2)
Alkaline Phosphatase: 66 U/L (ref 39–117)
BUN: 10 mg/dL (ref 6–23)
CO2: 29 mEq/L (ref 19–32)
Calcium: 9.6 mg/dL (ref 8.4–10.5)
Chloride: 100 mEq/L (ref 96–112)
Creatinine, Ser: 0.83 mg/dL (ref 0.40–1.20)
GFR: 72.05 mL/min (ref >60.00)
Glucose, Bld: 106 mg/dL — ABNORMAL HIGH (ref 70–99)
Potassium: 3.9 mEq/L (ref 3.5–5.1)
Sodium: 138 mEq/L (ref 135–145)
Total Bilirubin: 0.4 mg/dL (ref 0.2–1.2)
Total Protein: 7.7 g/dL (ref 6.0–8.3)

## 2021-09-24 LAB — CBC WITH DIFFERENTIAL/PLATELET
Basophils Absolute: 0.1 10*3/uL (ref 0.0–0.1)
Basophils Relative: 0.9 % (ref 0.0–3.0)
Eosinophils Absolute: 0.2 10*3/uL (ref 0.0–0.7)
Eosinophils Relative: 2.9 % (ref 0.0–5.0)
HCT: 38.3 % (ref 36.0–46.0)
Hemoglobin: 12.6 g/dL (ref 12.0–15.0)
Lymphocytes Relative: 31.3 % (ref 12.0–46.0)
Lymphs Abs: 2.2 10*3/uL (ref 0.7–4.0)
MCHC: 32.9 g/dL (ref 30.0–36.0)
MCV: 89.3 fl (ref 78.0–100.0)
Monocytes Absolute: 0.5 10*3/uL (ref 0.1–1.0)
Monocytes Relative: 7.2 % (ref 3.0–12.0)
Neutro Abs: 4 10*3/uL (ref 1.4–7.7)
Neutrophils Relative %: 57.7 % (ref 43.0–77.0)
Platelets: 342 10*3/uL (ref 150.0–400.0)
RBC: 4.29 Mil/uL (ref 3.87–5.11)
RDW: 12.6 % (ref 11.5–15.5)
WBC: 7 10*3/uL (ref 4.0–10.5)

## 2021-09-24 NOTE — Progress Notes (Signed)
Subjective:  Patient ID: [name], female    DOB: 05/25/1951  Age: 70 y.o. MRN: [id number]  CC: No chief complaint on file.   HPI Delaney Perona. [place] presents for cough and diarrhea f/up. The cough came back on Vancomycin taper (for C diff) when reduced Vancomycin to one tab bid. C/o spontaneous bruise on the R eye 2 d ago. Nasal d/c - greenish...  Outpatient Medications Prior to Visit  Medication Sig Dispense Refill   Cholecalciferol (VITAMIN D3) 2000 units capsule Take 1 capsule (2,000 Units total) by mouth daily. 100 capsule 3   saccharomyces boulardii (FLORASTOR) 250 MG capsule Take 1 capsule (250 mg total) by mouth 2 (two) times daily. 60 capsule 1   vancomycin (VANCOCIN) 125 MG capsule Re-start Vancomycin: 125 mg po qid x 2 weeks, then 125 mg po bid x 1 week, then 125 mg po 3 times a week x 4 weeks; then stop. 82 capsule 0   vancomycin (VANCOCIN) 125 MG capsule As directed use per previous presription 10 capsule 0   No facility-administered medications prior to visit.    ROS: Review of Systems  Constitutional:  Positive for fatigue. Negative for activity change, appetite change, chills, fever and unexpected weight change.  HENT:  Positive for postnasal drip, rhinorrhea and sinus pressure. Negative for congestion, mouth sores, nosebleeds and sore throat.   Eyes:  Negative for visual disturbance.  Respiratory:  Positive for cough. Negative for chest tightness.   Gastrointestinal:  Negative for abdominal distention, abdominal pain, diarrhea and nausea.  Genitourinary:  Negative for difficulty urinating, frequency and vaginal pain.  Musculoskeletal:  Negative for back pain and gait problem.  Skin:  Negative for pallor and rash.  Neurological:  Negative for dizziness, tremors, weakness, numbness and headaches.  Hematological:  Bruises/bleeds easily.  Psychiatric/Behavioral:  Negative for confusion and sleep disturbance.    Objective:  BP 140/78 (BP Location: Left Arm,  Patient Position: Sitting, Cuff Size: Large)    Pulse 79    Temp 98.3 F (36.8 C) (Oral)    Ht 5\' 5"  (1.651 m)    Wt 166 lb (75.3 kg)    LMP  (LMP Unknown)    SpO2 97%    BMI 27.62 kg/m   BP Readings from Last 3 Encounters:  09/24/21 140/78  08/27/21 (!) 141/76  08/12/21 (!) 142/80    Wt Readings from Last 3 Encounters:  09/24/21 166 lb (75.3 kg)  08/27/21 166 lb 3.2 oz (75.4 kg)  08/12/21 166 lb 12.8 oz (75.7 kg)    Physical Exam Constitutional:      General: She is not in acute distress.    Appearance: She is well-developed.  HENT:     Head: Normocephalic.     Right Ear: External ear normal.     Left Ear: External ear normal.     Nose: Nose normal.  Eyes:     General:        Right eye: No discharge.        Left eye: No discharge.     Conjunctiva/sclera: Conjunctivae normal.     Pupils: Pupils are equal, round, and reactive to light.  Neck:     Thyroid: No thyromegaly.     Vascular: No JVD.     Trachea: No tracheal deviation.  Cardiovascular:     Rate and Rhythm: Normal rate and regular rhythm.     Heart sounds: Normal heart sounds.  Pulmonary:     Effort: No respiratory distress.  Breath sounds: No stridor. No wheezing.  Abdominal:     General: Bowel sounds are normal. There is no distension.     Palpations: Abdomen is soft. There is no mass.     Tenderness: There is no abdominal tenderness. There is no guarding or rebound.  Musculoskeletal:        General: No tenderness.     Cervical back: Normal range of motion and neck supple. No rigidity.  Lymphadenopathy:     Cervical: No cervical adenopathy.  Skin:    Findings: No erythema or rash.  Neurological:     Cranial Nerves: No cranial nerve deficit.     Motor: No abnormal muscle tone.     Coordination: Coordination normal.     Deep Tendon Reflexes: Reflexes normal.  Psychiatric:        Behavior: Behavior normal.        Thought Content: Thought content normal.        Judgment: Judgment normal.   Light  bruise on the lateral R eyelids Lab Results  Component Value Date   WBC 6.8 07/30/2021   HGB 13.1 07/30/2021   HCT 39.4 07/30/2021   PLT 280.0 07/30/2021   GLUCOSE 99 07/30/2021   CHOL 165 06/23/2020   TRIG 70.0 06/23/2020   HDL 58.90 06/23/2020   LDLDIRECT 139.0 04/07/2009   LDLCALC 92 06/23/2020   ALT 12 07/30/2021   AST 13 07/30/2021   NA 141 07/30/2021   K 3.8 07/30/2021   CL 104 07/30/2021   CREATININE 0.76 07/30/2021   BUN 9 07/30/2021   CO2 27 07/30/2021   TSH 2.18 07/18/2020    NM PET Image Initial (PI) Skull Base To Thigh (F-18 FDG)  Result Date: 07/03/2021 CLINICAL DATA:  Initial treatment strategy for pulmonary nodules. EXAM: NUCLEAR MEDICINE PET SKULL BASE TO THIGH TECHNIQUE: 8 point sick mCi F-18 FDG was injected intravenously. Full-ring PET imaging was performed from the skull base to thigh after the radiotracer. CT data was obtained and used for attenuation correction and anatomic localization. Fasting blood glucose: 114 mg/dl COMPARISON:  CT chest 06/11/2021 FINDINGS: Mediastinal blood pool activity: SUV max 2.6 Liver activity: SUV max NA NECK: Symmetric tonsillar and glottic activity, likely physiologic. No pathologic adenopathy identified. Incidental CT findings: Chronic ethmoid and bilateral maxillary sinusitis. CHEST: Persistent somewhat curved nodularity peripherally in the left lower lobe measuring 2.5 by 1.3 cm, maximum SUV 2.1. Activity in the left supraspinatus muscle without CT correlate, maximum SUV 3.6, likely physiologic/incidental. No hypermetabolic or pathologic adenopathy observed. Incidental CT findings: Air-level in the esophagus, nonspecific but dysmotility is not excluded. Mild scarring or atelectasis in both lower lobes. ABDOMEN/PELVIS: Mildly accentuated activity in the right colon is likely physiologic given the lack of CT correlate. Incidental CT findings: Cholecystectomy. SKELETON: No significant abnormal hypermetabolic activity in this region.  Incidental CT findings: Thoracic kyphosis and mildly exaggerated lumbar lordosis. IMPRESSION: 1. Stable appearance of the peripheral curved left lower lobe nodule with maximum SUV of 2.1. Possibilities include benign bronchocele with local inflammation, versus low-grade malignancy. If tissue diagnosis is not elected, I would recommend CT imaging of the chest in 3-6 months time for initial re-evaluation. 2. Chronic ethmoid and bilateral maxillary sinusitis. 3. Air fluid level in the esophagus, nonspecific but dysmotility is not excluded. Electronically Signed   By: Van Clines M.D.   On: 07/03/2021 07:32    Assessment & Plan:   Problem List Items Addressed This Visit     Abnormal CT of the chest  Per Dr Lamonte Sakai: " left lower lobe pulmonary nodules unchanged in size or appearance.  No hypermetabolism on PET scan.  Reviewed this with her today.  I still have some concern about the fact that it appears to have evolved going back to 06/2019.  We will continue to follow it, next scan in 6 months.  If there is an interval change we will talk about strategies for either biopsy or resection. We reviewed your PET scan and your pulmonary function testing today. We will plan to repeat your CT scan of the chest in April 2023. "   The cough came back on Vancomycin taper (for C diff) when reduced Vancomycin to one tab bid.       Bruising    Light bruise on the lateral R eyelids Check CBC, INR      Relevant Orders   CBC with Differential/Platelet   Protime-INR   C. difficile colitis    Resolving diarrhea. The cough came back on Vancomycin taper (for C diff) when reduced Vancomycin to one tab bid.      Relevant Orders   CBC with Differential/Platelet   Comprehensive metabolic panel   Protime-INR   Chronic sinusitis, unspecified - Primary    Chronic ethmoid and bilateral maxillary sinusitis on PET CT 9/22 ENT ref No abx at the moment Irrigate sinuses       Relevant Orders   Ambulatory  referral to ENT      No orders of the defined types were placed in this encounter.     Follow-up: Return in about 6 weeks (around 11/05/2021) for a follow-up visit.  Walker Kehr, MD

## 2021-09-24 NOTE — Patient Instructions (Signed)
Vancomycin: 125 mg po qid x 2 weeks, then 125 mg po bid x 1 week, then 125 mg po 3 times a week x 4 weeks; then stop.

## 2021-09-24 NOTE — Addendum Note (Signed)
Addended by: Boris Lown B on: 09/24/2021 03:47 PM   Modules accepted: Orders

## 2021-09-24 NOTE — Assessment & Plan Note (Signed)
Chronic ethmoid and bilateral maxillary sinusitis on PET CT 9/22 ENT ref No abx at the moment Irrigate sinuses

## 2021-09-24 NOTE — Assessment & Plan Note (Signed)
Resolving diarrhea. The cough came back on Vancomycin taper (for C diff) when reduced Vancomycin to one tab bid.

## 2021-09-24 NOTE — Assessment & Plan Note (Signed)
Per Dr Lamonte Sakai: " left lower lobe pulmonary nodules unchanged in size or appearance.  No hypermetabolism on PET scan.  Reviewed this with her today.  I still have some concern about the fact that it appears to have evolved going back to 06/2019.  We will continue to follow it, next scan in 6 months.  If there is an interval change we will talk about strategies for either biopsy or resection. We reviewed your PET scan and your pulmonary function testing today. We will plan to repeat your CT scan of the chest in April 2023. "   The cough came back on Vancomycin taper (for C diff) when reduced Vancomycin to one tab bid.

## 2021-09-24 NOTE — Assessment & Plan Note (Signed)
Light bruise on the lateral R eyelids Check CBC, INR

## 2021-09-25 LAB — PROTIME-INR
INR: 1
Prothrombin Time: 10.1 s (ref 9.0–11.5)

## 2021-11-03 ENCOUNTER — Telehealth: Payer: Self-pay

## 2021-11-03 ENCOUNTER — Other Ambulatory Visit: Payer: Self-pay

## 2021-11-03 ENCOUNTER — Ambulatory Visit (INDEPENDENT_AMBULATORY_CARE_PROVIDER_SITE_OTHER): Payer: Medicare Other | Admitting: Internal Medicine

## 2021-11-03 ENCOUNTER — Encounter: Payer: Self-pay | Admitting: Internal Medicine

## 2021-11-03 DIAGNOSIS — A0472 Enterocolitis due to Clostridium difficile, not specified as recurrent: Secondary | ICD-10-CM

## 2021-11-03 DIAGNOSIS — R9389 Abnormal findings on diagnostic imaging of other specified body structures: Secondary | ICD-10-CM

## 2021-11-03 DIAGNOSIS — J322 Chronic ethmoidal sinusitis: Secondary | ICD-10-CM | POA: Diagnosis not present

## 2021-11-03 MED ORDER — MOMETASONE FUROATE 50 MCG/ACT NA SUSP
2.0000 | Freq: Every day | NASAL | 2 refills | Status: DC
Start: 2021-11-03 — End: 2021-12-31

## 2021-11-03 MED ORDER — LORATADINE 10 MG PO TABS: 10.0000 mg | ORAL_TABLET | Freq: Every day | ORAL | 11 refills | Status: DC

## 2021-11-03 NOTE — Assessment & Plan Note (Signed)
Keep appt w/Dr Constance Holster Try Nasonex Intolerant of many meds

## 2021-11-03 NOTE — Assessment & Plan Note (Signed)
F/u w/Dr Byrum 

## 2021-11-03 NOTE — Telephone Encounter (Signed)
Patient here for a visit and wondered if she is suppose to start a anti-biotic as the discuss in previous visit?

## 2021-11-03 NOTE — Assessment & Plan Note (Signed)
Resolved No diarrhea. Pt has finished Vancomycin taper

## 2021-11-03 NOTE — Progress Notes (Signed)
Subjective:  Patient ID: [name], female    DOB: 1950/10/16  Age: 71 y.o. MRN: [id number]  CC: Follow-up   HPI [name] presents for  for cough,sinusitis sx's  and diarrhea f/up. No diarrhea. Pt has finished Vancomycin taper (for C diff)t. Pt was supposed to see Dr Pollyann Kennedy today - her appt was cancelled.  Outpatient Medications Prior to Visit  Medication Sig Dispense Refill   Cholecalciferol (VITAMIN D3) 2000 units capsule Take 1 capsule (2,000 Units total) by mouth daily. 100 capsule 3   BINAXNOW COVID-19 AG HOME TEST KIT See admin instructions.     saccharomyces boulardii (FLORASTOR) 250 MG capsule Take 1 capsule (250 mg total) by mouth 2 (two) times daily. (Patient not taking: Reported on 11/03/2021) 60 capsule 1   vancomycin (VANCOCIN) 125 MG capsule Re-start Vancomycin: 125 mg po qid x 2 weeks, then 125 mg po bid x 1 week, then 125 mg po 3 times a week x 4 weeks; then stop. (Patient not taking: Reported on 11/03/2021) 82 capsule 0   No facility-administered medications prior to visit.    ROS: Review of Systems  Constitutional:  Negative for activity change, appetite change, chills, fatigue and unexpected weight change.  HENT:  Positive for postnasal drip, sinus pressure and sinus pain. Negative for congestion and mouth sores.   Eyes:  Negative for visual disturbance.  Respiratory:  Negative for cough, chest tightness, shortness of breath and wheezing.   Gastrointestinal:  Negative for abdominal pain, diarrhea and nausea.  Genitourinary:  Negative for difficulty urinating, frequency and vaginal pain.  Musculoskeletal:  Negative for back pain and gait problem.  Skin:  Negative for pallor and rash.  Neurological:  Negative for dizziness, tremors, weakness, numbness and headaches.  Psychiatric/Behavioral:  Negative for confusion and sleep disturbance.    Objective:  BP (!) 146/82 (BP Location: Left Arm, Patient Position: Sitting, Cuff Size: Normal)    Pulse 84     Temp 97.9 F (36.6 C) (Oral)    Ht 5\' 5"  (1.651 m)    Wt 164 lb (74.4 kg)    LMP  (LMP Unknown)    SpO2 98%    BMI 27.29 kg/m   BP Readings from Last 3 Encounters:  11/03/21 (!) 146/82  09/24/21 140/78  08/27/21 (!) 141/76    Wt Readings from Last 3 Encounters:  11/03/21 164 lb (74.4 kg)  09/24/21 166 lb (75.3 kg)  08/27/21 166 lb 3.2 oz (75.4 kg)    Physical Exam Constitutional:      General: She is not in acute distress.    Appearance: She is well-developed.  HENT:     Head: Normocephalic.     Right Ear: External ear normal.     Left Ear: External ear normal.     Nose: Nose normal.  Eyes:     General:        Right eye: No discharge.        Left eye: No discharge.     Conjunctiva/sclera: Conjunctivae normal.     Pupils: Pupils are equal, round, and reactive to light.  Neck:     Thyroid: No thyromegaly.     Vascular: No JVD.     Trachea: No tracheal deviation.  Cardiovascular:     Rate and Rhythm: Normal rate and regular rhythm.     Heart sounds: Normal heart sounds.  Pulmonary:     Effort: No respiratory distress.     Breath sounds: No stridor. No wheezing.  Abdominal:     General: Bowel sounds are normal. There is no distension.     Palpations: Abdomen is soft. There is no mass.     Tenderness: There is no abdominal tenderness. There is no guarding or rebound.  Musculoskeletal:        General: No tenderness.     Cervical back: Normal range of motion and neck supple. No rigidity.  Lymphadenopathy:     Cervical: No cervical adenopathy.  Skin:    Findings: No erythema or rash.  Neurological:     Cranial Nerves: No cranial nerve deficit.     Motor: No abnormal muscle tone.     Coordination: Coordination normal.     Deep Tendon Reflexes: Reflexes normal.  Psychiatric:        Behavior: Behavior normal.        Thought Content: Thought content normal.        Judgment: Judgment normal.  Clear nasal drainage L>R  Lab Results  Component Value Date   WBC 7.0  09/24/2021   HGB 12.6 09/24/2021   HCT 38.3 09/24/2021   PLT 342.0 09/24/2021   GLUCOSE 106 (H) 09/24/2021   CHOL 165 06/23/2020   TRIG 70.0 06/23/2020   HDL 58.90 06/23/2020   LDLDIRECT 139.0 04/07/2009   LDLCALC 92 06/23/2020   ALT 17 09/24/2021   AST 18 09/24/2021   NA 138 09/24/2021   K 3.9 09/24/2021   CL 100 09/24/2021   CREATININE 0.83 09/24/2021   BUN 10 09/24/2021   CO2 29 09/24/2021   TSH 2.18 07/18/2020   INR 1.0 09/24/2021    NM PET Image Initial (PI) Skull Base To Thigh (F-18 FDG)  Result Date: 07/03/2021 CLINICAL DATA:  Initial treatment strategy for pulmonary nodules. EXAM: NUCLEAR MEDICINE PET SKULL BASE TO THIGH TECHNIQUE: 8 point sick mCi F-18 FDG was injected intravenously. Full-ring PET imaging was performed from the skull base to thigh after the radiotracer. CT data was obtained and used for attenuation correction and anatomic localization. Fasting blood glucose: 114 mg/dl COMPARISON:  CT chest 06/11/2021 FINDINGS: Mediastinal blood pool activity: SUV max 2.6 Liver activity: SUV max NA NECK: Symmetric tonsillar and glottic activity, likely physiologic. No pathologic adenopathy identified. Incidental CT findings: Chronic ethmoid and bilateral maxillary sinusitis. CHEST: Persistent somewhat curved nodularity peripherally in the left lower lobe measuring 2.5 by 1.3 cm, maximum SUV 2.1. Activity in the left supraspinatus muscle without CT correlate, maximum SUV 3.6, likely physiologic/incidental. No hypermetabolic or pathologic adenopathy observed. Incidental CT findings: Air-level in the esophagus, nonspecific but dysmotility is not excluded. Mild scarring or atelectasis in both lower lobes. ABDOMEN/PELVIS: Mildly accentuated activity in the right colon is likely physiologic given the lack of CT correlate. Incidental CT findings: Cholecystectomy. SKELETON: No significant abnormal hypermetabolic activity in this region. Incidental CT findings: Thoracic kyphosis and mildly  exaggerated lumbar lordosis. IMPRESSION: 1. Stable appearance of the peripheral curved left lower lobe nodule with maximum SUV of 2.1. Possibilities include benign bronchocele with local inflammation, versus low-grade malignancy. If tissue diagnosis is not elected, I would recommend CT imaging of the chest in 3-6 months time for initial re-evaluation. 2. Chronic ethmoid and bilateral maxillary sinusitis. 3. Air fluid level in the esophagus, nonspecific but dysmotility is not excluded. Electronically Signed   By: Van Clines M.D.   On: 07/03/2021 07:32    Assessment & Plan:   Problem List Items Addressed This Visit     Abnormal CT of the chest    F/u w/Dr  Byrum      C. difficile colitis    Resolved No diarrhea. Pt has finished Vancomycin taper      Chronic sinusitis, unspecified    Keep appt w/Dr Constance Holster Try Nasonex Intolerant of many meds      Relevant Medications   mometasone (NASONEX) 50 MCG/ACT nasal spray   loratadine (CLARITIN) 10 MG tablet      Meds ordered this encounter  Medications   mometasone (NASONEX) 50 MCG/ACT nasal spray    Sig: Place 2 sprays into the nose daily.    Dispense:  17 g    Refill:  2   loratadine (CLARITIN) 10 MG tablet    Sig: Take 1 tablet (10 mg total) by mouth daily.    Dispense:  30 tablet    Refill:  11      Follow-up: Return in about 3 months (around 01/31/2022) for a follow-up visit.  Walker Kehr, MD

## 2021-11-03 NOTE — Telephone Encounter (Signed)
No - she should see ENT first. Thx

## 2021-11-04 NOTE — Telephone Encounter (Signed)
Pt states that she is an est pt with Dr. Melida Quitter and the referral states Dr. Constance Holster. The appt is with Dr. Redmond Baseman and the pt is stuck on seeing Dr. Constance Holster.  Pt CB [phone]

## 2021-11-05 NOTE — Telephone Encounter (Signed)
Please see Dr. Redmond Baseman on Monday 2/13. Thanks

## 2021-11-05 NOTE — Telephone Encounter (Signed)
Pt is wondering if she should wait until March to see Dr. Constance Flores 3/9 or see  Dr. Redmond Baseman on Monday 2/13.  Please advise CB [phone]

## 2021-11-06 ENCOUNTER — Encounter: Payer: Self-pay | Admitting: Internal Medicine

## 2021-11-09 DIAGNOSIS — J342 Deviated nasal septum: Secondary | ICD-10-CM | POA: Diagnosis not present

## 2021-11-09 DIAGNOSIS — J320 Chronic maxillary sinusitis: Secondary | ICD-10-CM | POA: Diagnosis not present

## 2021-11-09 DIAGNOSIS — J329 Chronic sinusitis, unspecified: Secondary | ICD-10-CM | POA: Diagnosis not present

## 2021-11-11 ENCOUNTER — Ambulatory Visit (INDEPENDENT_AMBULATORY_CARE_PROVIDER_SITE_OTHER): Payer: Medicare Other | Admitting: Emergency Medicine

## 2021-11-11 ENCOUNTER — Encounter: Payer: Self-pay | Admitting: Emergency Medicine

## 2021-11-11 ENCOUNTER — Other Ambulatory Visit: Payer: Self-pay

## 2021-11-11 DIAGNOSIS — R053 Chronic cough: Secondary | ICD-10-CM

## 2021-11-11 DIAGNOSIS — R9389 Abnormal findings on diagnostic imaging of other specified body structures: Secondary | ICD-10-CM

## 2021-11-11 NOTE — Assessment & Plan Note (Signed)
Left lower lobe pulmonary nodule, needs follow-up in April 2023.  Repeat CT at that time, review.  Depending on any interval change, stability we will determine whether any other work-up is indicated including possible bronchoscopy.

## 2021-11-11 NOTE — Progress Notes (Signed)
Subjective:    Patient ID: [name]. Thomley, female    DOB: 07/24/1951, 71 y.o.   MRN: [id number]  HPI  ROV 07/08/2021 --follow-up visit 71 year old never smoker who has been dealing with chronic cough, chronic bronchitic symptoms.  I saw her for this and also for an abnormal CT scan of the chest that identified a 2.8 x 1.0 cm irregular anterior left lower lobe density.  Appeared to have some atelectatic change on a cardiac CT at that location in 06/2019.  PET scan done on 07/01/2021 reviewed by me showed no change in size or appearance of the left lower lobe pulmonary nodule, no hypermetabolism with an SUV of 2.1.  Pulmonary function testing performed 06/25/2021 reviewed by me, show mixed obstruction and restriction with an FEV1 75% predicted, normal lung volumes, normal diffusion capacity.  ROV 11/11/2021 --71 year old woman, never smoker whom I have seen for chronic cough and chronic bronchitic symptoms, and abnormal CT scan of the chest with a 2.8 x 1.0 cm irregular anterior left lower lobe opacity.  This was stable on PET scan done on 07/01/2021 without any hypermetabolism but may have involved compared with the cardiac CT done 06/2019.  We elected to follow repeat imaging next planned for April 2023.  Pulmonary function testing has shown mixed obstruction and restriction. Today she reports that she has been having nasosinus pain, some L ear pain, HA. Saw Dr Redmond Baseman w ENT and had Ct sinuses 2/13 that showed ethmoid and maxillary sinusitis, ? Associated w L molar. Was recommended that she see dentistry. Occasional cough, not every day.    Review of Systems As per HPI  Past Medical History:  Diagnosis Date   Anemia    with a pregnancy    GERD (gastroesophageal reflux disease)    pt denies this dx    SVD (spontaneous vaginal delivery)    x 5     Family History  Problem Relation Age of Onset   Aneurysm Father        AAA   Cancer Brother 55       eye melanoma   Colon cancer Neg Hx     Esophageal cancer Neg Hx    Rectal cancer Neg Hx    Stomach cancer Neg Hx    Pancreatic cancer Neg Hx      Social History   Socioeconomic History   Marital status: Married    Spouse name: Not on file   Number of children: 5   Years of education: Not on file   Highest education level: Not on file  Occupational History   Not on file  Tobacco Use   Smoking status: Never   Smokeless tobacco: Never  Vaping Use   Vaping Use: Never used  Substance and Sexual Activity   Alcohol use: No    Alcohol/week: 0.0 standard drinks   Drug use: No   Sexual activity: Yes    Birth control/protection: Post-menopausal  Other Topics Concern   Not on file  Social History Narrative   Not on file   Social Determinants of Health   Financial Resource Strain: Not on file  Food Insecurity: Not on file  Transportation Needs: Not on file  Physical Activity: Not on file  Stress: Not on file  Social Connections: Not on file  Intimate Partner Violence: Not on file     No Known Allergies   Outpatient Medications Prior to Visit  Medication Sig Dispense Refill   Cholecalciferol (VITAMIN D3) 2000 units capsule Take  1 capsule (2,000 Units total) by mouth daily. 100 capsule 3   BINAXNOW COVID-19 AG HOME TEST KIT See admin instructions.     loratadine (CLARITIN) 10 MG tablet Take 1 tablet (10 mg total) by mouth daily. (Patient not taking: Reported on 11/11/2021) 30 tablet 11   mometasone (NASONEX) 50 MCG/ACT nasal spray Place 2 sprays into the nose daily. (Patient not taking: Reported on 11/11/2021) 17 g 2   saccharomyces boulardii (FLORASTOR) 250 MG capsule Take 1 capsule (250 mg total) by mouth 2 (two) times daily. (Patient not taking: Reported on 11/03/2021) 60 capsule 1   vancomycin (VANCOCIN) 125 MG capsule Re-start Vancomycin: 125 mg po qid x 2 weeks, then 125 mg po bid x 1 week, then 125 mg po 3 times a week x 4 weeks; then stop. 82 capsule 0   No facility-administered medications prior to visit.          Objective:   Physical Exam  Vitals:   11/11/21 0916  BP: (!) 152/82  Pulse: 71  Temp: 98.5 F (36.9 C)  TempSrc: Oral  SpO2: 97%  Weight: 161 lb (73 kg)  Height: _0  (1.651 m)   Gen: Pleasant, overwt woman, in no distress,  normal affect  ENT: No lesions,  mouth clear,  oropharynx clear, no postnasal drip  Neck: No JVD, no stridor  Lungs: No use of accessory muscles, no crackles or wheezing on normal respiration, no wheeze on forced expiration  Cardiovascular: RRR, heart sounds normal, no murmur or gallops, no peripheral edema  Musculoskeletal: No deformities, no cyanosis or clubbing  Neuro: alert, awake, non focal, tangential historian  Skin: Warm, no lesions or rash      Assessment & Plan:   Abnormal CT of the chest Left lower lobe pulmonary nodule, needs follow-up in April 2023.  Repeat CT at that time, review.  Depending on any interval change, stability we will determine whether any other work-up is indicated including possible bronchoscopy.  Cough Cough is actually improved.  She does still have some congestion, rhinitis intermittently.  She is questioning whether she should start a nasal steroid.  I have asked her to use this based on how intrusive her symptoms become  Chronic sinusitis, unspecified Ethmoid and maxillary sinus disease, left-sided pain.  Her most recent imaging suggests possible molar involvement, source for her discomfort.  She needs to be seen by dentistry.  Discussed this with her today.   Baltazar Apo, MD, PhD 11/11/2021, 9:41 AM [facility] [phone] or if no answer before 7:00PM call [phone] For any issues after 7:00PM please call eLink [phone]

## 2021-11-11 NOTE — Assessment & Plan Note (Signed)
Cough is actually improved.  She does still have some congestion, rhinitis intermittently.  She is questioning whether she should start a nasal steroid.  I have asked her to use this based on how intrusive her symptoms become

## 2021-11-11 NOTE — Patient Instructions (Signed)
Agree with seeking care with dentistry and being evaluated for your left molar which may be contributing to sinus disease Okay to start steroid nasal spray as needed for congestion, drainage, allergic symptoms. Get your CT scan of the chest in April 2023 as planned so we can follow your small pulmonary nodule. Follow Dr. Lamonte Sakai in April after your CT scan so we can review the results together.

## 2021-11-11 NOTE — Assessment & Plan Note (Signed)
Ethmoid and maxillary sinus disease, left-sided pain.  Her most recent imaging suggests possible molar involvement, source for her discomfort.  She needs to be seen by dentistry.  Discussed this with her today.

## 2021-11-27 ENCOUNTER — Telehealth: Payer: Self-pay | Admitting: Emergency Medicine

## 2021-11-27 NOTE — Telephone Encounter (Signed)
Called patient and she states that she is a little confused about what scan needs to be done.  ? ?In the chart that two CT chest were ordered WO contrast. ? ?One is scheduled for 12/28/2021 at 1500  ? ?And the other one is just ordered  ? ?Dr Lamonte Sakai please advise  ?

## 2021-11-30 NOTE — Telephone Encounter (Signed)
She needs a CT in April - we probably reordered it at her OV, but she only needs the one scan. Then follow up to review.  ?

## 2021-12-01 NOTE — Telephone Encounter (Signed)
Called patient but she did not answer. I was not able to leave a VM due to her VM being full. Will attempt to call back later.  ?

## 2021-12-01 NOTE — Telephone Encounter (Signed)
Spoke with the pt and notified of response per RB. She states she is confused about what type of CT was scheduled. PCC's, can you please ensure it was scheduled correctly? Thanks! ?

## 2021-12-01 NOTE — Telephone Encounter (Signed)
It looks the the CT ordered on 2/15 was cancelled.  The patient has been scheduled for the super D Ct ordered on 07/08/21.   ?

## 2021-12-24 NOTE — Telephone Encounter (Signed)
Spoke with the pt and notified that her ct chest is scheduled for 12/28/21 and that Dr Lamonte Sakai will not be there when she has the scan done.  ?

## 2021-12-24 NOTE — Telephone Encounter (Signed)
Patient would like to know if CT scan needs to be with contrast. Patient phone number is [phone]. ?

## 2021-12-24 NOTE — Telephone Encounter (Signed)
Called to set up f/u appt, pt wanted to ask Dr. Lamonte Sakai for sooner appt than 01/28/22. Pt has questions about  upcoming CT.  pt is wondering when she needed to stop eating and the cut off for liquids. Pt states that instructions are for a  Gi CT & she unsure what the difference is between that & a regular one & the difference between a super D & regular CT. Also if she was to get one with contrast would it be better Lastly wondering if Dr. Lamonte Sakai will be there , did advise pt Dr. Lamonte Sakai would not be there multiple times but pt still would like to verify [phone]  ?

## 2021-12-28 ENCOUNTER — Ambulatory Visit
Admission: RE | Admit: 2021-12-28 | Discharge: 2021-12-28 | Disposition: A | Discharge status: Home / Self Care | Payer: Medicare Other | Source: Ambulatory Visit | Attending: Emergency Medicine | Admitting: Emergency Medicine

## 2021-12-28 ENCOUNTER — Other Ambulatory Visit: Payer: Medicare Other

## 2021-12-28 DIAGNOSIS — I700 Atherosclerosis of aorta: Secondary | ICD-10-CM | POA: Diagnosis not present

## 2021-12-28 DIAGNOSIS — R9389 Abnormal findings on diagnostic imaging of other specified body structures: Secondary | ICD-10-CM

## 2021-12-28 DIAGNOSIS — R911 Solitary pulmonary nodule: Secondary | ICD-10-CM | POA: Diagnosis not present

## 2021-12-31 ENCOUNTER — Ambulatory Visit (INDEPENDENT_AMBULATORY_CARE_PROVIDER_SITE_OTHER): Payer: Medicare Other | Admitting: Emergency Medicine

## 2021-12-31 ENCOUNTER — Encounter: Payer: Self-pay | Admitting: Emergency Medicine

## 2021-12-31 DIAGNOSIS — J0100 Acute maxillary sinusitis, unspecified: Secondary | ICD-10-CM | POA: Diagnosis not present

## 2021-12-31 DIAGNOSIS — R9389 Abnormal findings on diagnostic imaging of other specified body structures: Secondary | ICD-10-CM | POA: Diagnosis not present

## 2021-12-31 MED ORDER — AMOXICILLIN-POT CLAVULANATE 875-125 MG PO TABS: 1.0000 | ORAL_TABLET | Freq: Two times a day (BID) | ORAL | 0 refills | Status: DC

## 2021-12-31 NOTE — Patient Instructions (Signed)
Please take Augmentin 875 mg twice a day for 7 days. ?Follow with ENT if you continue to have sinus symptoms ?We reviewed your CT scan of the chest today.  Your left lung opacity is smaller compared with priors.  This is good news.  We should not need to follow any further CT scans unless you experience a clinical change. ?Follow Dr. Lamonte Sakai in 1 year or sooner if you have any problems. ?

## 2021-12-31 NOTE — Assessment & Plan Note (Signed)
Green bloody nasal discharge, sinus headache and sinusitis noted on her CT from February.  She has seen dentistry, not clear that process is related to left molar.  I will treat her with Augmentin and asked her to follow-up with ENT either Dr. Redmond Baseman or second opinion if she desires. ?

## 2021-12-31 NOTE — Progress Notes (Signed)
? ?Subjective:  ? ? Patient ID: [name]. Bedore, female    DOB: Sep 20, 1951, 71 y.o.   MRN: [id number] ? ?HPI ? ?ROV 07/08/2021 --follow-up visit 71 year old never smoker who has been dealing with chronic cough, chronic bronchitic symptoms.  I saw her for this and also for an abnormal CT scan of the chest that identified a 2.8 x 1.0 cm irregular anterior left lower lobe density.  Appeared to have some atelectatic change on a cardiac CT at that location in 06/2019. ? ?PET scan done on 07/01/2021 reviewed by me showed no change in size or appearance of the left lower lobe pulmonary nodule, no hypermetabolism with an SUV of 2.1. ? ?Pulmonary function testing performed 06/25/2021 reviewed by me, show mixed obstruction and restriction with an FEV1 75% predicted, normal lung volumes, normal diffusion capacity. ? ?ROV 11/11/2021 --71 year old woman, never smoker whom I have seen for chronic cough and chronic bronchitic symptoms, and abnormal CT scan of the chest with a 2.8 x 1.0 cm irregular anterior left lower lobe opacity.  This was stable on PET scan done on 07/01/2021 without any hypermetabolism but may have involved compared with the cardiac CT done 06/2019.  We elected to follow repeat imaging next planned for April 2023.  Pulmonary function testing has shown mixed obstruction and restriction. ?Today she reports that she has been having nasosinus pain, some L ear pain, HA. Saw Dr Redmond Baseman w ENT and had Ct sinuses 2/13 that showed ethmoid and maxillary sinusitis, ? Associated w L molar. Was recommended that she see dentistry. Occasional cough, not every day. She is not SOB, is not on BD. Intermittent cough.  ? ? ?ROV 12/31/21 --[name] is a 59, never smoker, with history of chronic cough, mixed obstruction and restriction on pulmonary function testing, chronic rhinitis and an abnormal CT scan of the chest.  She is a 2.8 x 1.0 cm anterior left lower lobe opacity, stable on PET scan 06/2021 without any hypermetabolism. ?She  reports today that she continues to have off/on sinus disease. She did see dentistry. She felt too dry with nasonex and loratadine, is off of these now. Occasional nose bleeding, some green discharge.  ? ?CT chest 12/28/21 reviewed by me, shows that the left lower lobe irregular pleural-based density has decreased in size, now 1.5 x 1.0 cm consistent with some inflammatory change, possible impacted mucus.  Other tiny pulmonary nodules are unchanged, likely benign. ? ? ? ?Review of Systems ?As per HPI ? ?Past Medical History:  ?Diagnosis Date  ? Anemia   ? with a pregnancy   ? GERD (gastroesophageal reflux disease)   ? pt denies this dx   ? SVD (spontaneous vaginal delivery)   ? x 5  ?  ? ?Family History  ?Problem Relation Age of Onset  ? Aneurysm Father   ?     AAA  ? Cancer Brother 56  ?     eye melanoma  ? Colon cancer Neg Hx   ? Esophageal cancer Neg Hx   ? Rectal cancer Neg Hx   ? Stomach cancer Neg Hx   ? Pancreatic cancer Neg Hx   ?  ? ?Social History  ? ?Socioeconomic History  ? Marital status: Married  ?  Spouse name: Not on file  ? Number of children: 5  ? Years of education: Not on file  ? Highest education level: Not on file  ?Occupational History  ? Not on file  ?Tobacco Use  ? Smoking status: Never  ?  Smokeless tobacco: Never  ?Vaping Use  ? Vaping Use: Never used  ?Substance and Sexual Activity  ? Alcohol use: No  ?  Alcohol/week: 0.0 standard drinks  ? Drug use: No  ? Sexual activity: Yes  ?  Birth control/protection: Post-menopausal  ?Other Topics Concern  ? Not on file  ?Social History Narrative  ? Not on file  ? ?Social Determinants of Health  ? ?Financial Resource Strain: Not on file  ?Food Insecurity: Not on file  ?Transportation Needs: Not on file  ?Physical Activity: Not on file  ?Stress: Not on file  ?Social Connections: Not on file  ?Intimate Partner Violence: Not on file  ?  ? ?No Known Allergies  ? ?Outpatient Medications Prior to Visit  ?Medication Sig Dispense Refill  ? Cholecalciferol  (VITAMIN D3) 2000 units capsule Take 1 capsule (2,000 Units total) by mouth daily. 100 capsule 3  ? vancomycin (VANCOCIN) 125 MG capsule Re-start Vancomycin: 125 mg po qid x 2 weeks, then 125 mg po bid x 1 week, then 125 mg po 3 times a week x 4 weeks; then stop. 82 capsule 0  ? BINAXNOW COVID-19 AG HOME TEST KIT See admin instructions. (Patient not taking: Reported on 12/31/2021)    ? fluconazole (DIFLUCAN) 150 MG tablet Take 150 mg by mouth once. (Patient not taking: Reported on 12/31/2021)    ? loratadine (CLARITIN) 10 MG tablet Take 1 tablet (10 mg total) by mouth daily. (Patient not taking: Reported on 11/11/2021) 30 tablet 11  ? mometasone (NASONEX) 50 MCG/ACT nasal spray Place 2 sprays into the nose daily. (Patient not taking: Reported on 11/11/2021) 17 g 2  ? saccharomyces boulardii (FLORASTOR) 250 MG capsule Take 1 capsule (250 mg total) by mouth 2 (two) times daily. (Patient not taking: Reported on 11/03/2021) 60 capsule 1  ? ?No facility-administered medications prior to visit.  ? ? ? ? ?   ?Objective:  ? Physical Exam ? ?Vitals:  ? 12/31/21 0913  ?BP: 118/80  ?Pulse: 85  ?Temp: 97.9 ?F (36.6 ?C)  ?TempSrc: Oral  ?SpO2: 97%  ?Weight: 162 lb 12.8 oz (73.8 kg)  ?Height: _0  (1.651 m)  ? ?Gen: Pleasant, overwt woman, in no distress,  normal affect ? ?ENT: No lesions,  mouth clear,  oropharynx clear, no postnasal drip ? ?Neck: No JVD, no stridor ? ?Lungs: No use of accessory muscles, no crackles or wheezing on normal respiration, no wheeze on forced expiration ? ?Cardiovascular: RRR, heart sounds normal, no murmur or gallops, no peripheral edema ? ?Musculoskeletal: No deformities, no cyanosis or clubbing ? ?Neuro: alert, awake, non focal, poor tangential historian ? ?Skin: Warm, no lesions or rash ? ? ?   ?Assessment & Plan:  ? ?Acute sinusitis ?Green bloody nasal discharge, sinus headache and sinusitis noted on her CT from February.  She has seen dentistry, not clear that process is related to left molar.  I will  treat her with Augmentin and asked her to follow-up with ENT either Dr. Redmond Baseman or second opinion if she desires. ? ?Abnormal CT of the chest ?Linear left lower lobe nodular infiltrate is smaller, and consistent with malignancy.  Suspect this was an area of resolving bronchitis/pneumonia from mucoid impaction.  She should not need dedicated follow-up CT unless she has a clinical change. ? ? ?Baltazar Apo, MD, PhD ?12/31/2021, 9:45 AM ?[facility] ?[phone] or if no answer before 7:00PM call [phone] ?For any issues after 7:00PM please call eLink [phone] ? ?

## 2021-12-31 NOTE — Assessment & Plan Note (Signed)
Linear left lower lobe nodular infiltrate is smaller, and consistent with malignancy.  Suspect this was an area of resolving bronchitis/pneumonia from mucoid impaction.  She should not need dedicated follow-up CT unless she has a clinical change. ?

## 2022-01-04 ENCOUNTER — Telehealth: Payer: Self-pay | Admitting: Emergency Medicine

## 2022-01-04 NOTE — Telephone Encounter (Signed)
Called and spoke with patient. She stated that she had a convo with Barnet Pall on 12/31/21 in regrets to taking a probiotic with her amoxicillin. She wanted to know which probiotic to take since she was already eating yogurt on a daily basis. I went over a few options for her and she verbalized understanding. She stated that Barnet Pall gave her the name of a particular one but she can not remember the name. She would like to speak with Barnet Pall.  ? ? ?Barnet Pall, please advise. Thanks.  ?

## 2022-01-05 NOTE — Telephone Encounter (Signed)
My chart message sent to patient.  Closing encounter.

## 2022-01-06 ENCOUNTER — Other Ambulatory Visit: Payer: Self-pay | Admitting: Emergency Medicine

## 2022-01-08 ENCOUNTER — Encounter: Payer: Self-pay | Admitting: Internal Medicine

## 2022-01-08 ENCOUNTER — Ambulatory Visit (INDEPENDENT_AMBULATORY_CARE_PROVIDER_SITE_OTHER): Payer: Medicare Other | Admitting: Internal Medicine

## 2022-01-08 DIAGNOSIS — J322 Chronic ethmoidal sinusitis: Secondary | ICD-10-CM | POA: Diagnosis not present

## 2022-01-08 DIAGNOSIS — A0472 Enterocolitis due to Clostridium difficile, not specified as recurrent: Secondary | ICD-10-CM

## 2022-01-08 DIAGNOSIS — B3731 Acute candidiasis of vulva and vagina: Secondary | ICD-10-CM

## 2022-01-08 MED ORDER — FLUCONAZOLE 150 MG PO TABS: 150.0000 mg | ORAL_TABLET | Freq: Once | ORAL | 1 refills | Status: AC

## 2022-01-08 MED ORDER — AMOXICILLIN-POT CLAVULANATE 875-125 MG PO TABS: 1.0000 | ORAL_TABLET | Freq: Two times a day (BID) | ORAL | 0 refills | Status: DC

## 2022-01-08 MED ORDER — ALIGN 4 MG PO CAPS: 1.0000 | ORAL_CAPSULE | Freq: Every day | ORAL | 1 refills | Status: DC

## 2022-01-08 NOTE — Progress Notes (Signed)
? ?Subjective:  ?Patient ID: [name], female    DOB: [birth year]  Age: 71 y.o. MRN: [id number] ? ?CC: No chief complaint on file. ? ? ?HPI ?[name] presents for sinus congestion - [name] saw Dr Redmond Baseman, had a CT on 11/09/21. She had more dental work done. ? ?Pt saw Dr Lamonte Sakai - had another chest CT. ? ? CT IMPRESSION: ?The left lower lobe pleural-based density is decreased, most ?consistent with resolving infection/inflammation. Residual ?relatively linear density likely represents impacted mucus. ?  ?Tiny bilateral pulmonary nodules are similar to on the prior exam, ?favored to be benign. ? ?Outpatient Medications Prior to Visit  ?Medication Sig Dispense Refill  ? Cholecalciferol (VITAMIN D3) 2000 units capsule Take 1 capsule (2,000 Units total) by mouth daily. 100 capsule 3  ? amoxicillin-clavulanate (AUGMENTIN) 875-125 MG tablet Take 1 tablet by mouth 2 (two) times daily. 14 tablet 0  ? vancomycin (VANCOCIN) 125 MG capsule Re-start Vancomycin: 125 mg po qid x 2 weeks, then 125 mg po bid x 1 week, then 125 mg po 3 times a week x 4 weeks; then stop. (Patient not taking: Reported on 01/08/2022) 82 capsule 0  ? ?No facility-administered medications prior to visit.  ? ? ?ROS: ?Review of Systems  ?Constitutional:  Positive for fatigue. Negative for activity change, appetite change, chills and unexpected weight change.  ?HENT:  Positive for congestion, postnasal drip, sinus pain and sore throat. Negative for hearing loss, mouth sores and sinus pressure.   ?Eyes:  Negative for visual disturbance.  ?Respiratory:  Negative for cough and chest tightness.   ?Cardiovascular:  Negative for chest pain.  ?Gastrointestinal:  Negative for abdominal pain, diarrhea and nausea.  ?Genitourinary:  Negative for difficulty urinating, frequency and vaginal pain.  ?Musculoskeletal:  Negative for back pain and gait problem.  ?Skin:  Negative for pallor and rash.  ?Neurological:  Negative for dizziness, tremors, weakness, numbness  and headaches.  ?Psychiatric/Behavioral:  Negative for confusion and sleep disturbance.   ? ?Objective:  ?BP 128/70 (BP Location: Left Arm, Patient Position: Sitting, Cuff Size: Large)   Pulse 79   Temp 98.2 ?F (36.8 ?C) (Oral)   Ht '5\' 5"'$  (1.651 m)   Wt 162 lb (73.5 kg)   LMP  (LMP Unknown)   SpO2 97%   BMI 26.96 kg/m?  ? ?BP Readings from Last 3 Encounters:  ?01/15/22 138/80  ?01/08/22 128/70  ?12/31/21 118/80  ? ? ?Wt Readings from Last 3 Encounters:  ?01/15/22 162 lb 9.6 oz (73.8 kg)  ?01/08/22 162 lb (73.5 kg)  ?12/31/21 162 lb 12.8 oz (73.8 kg)  ? ? ?Physical Exam ?Constitutional:   ?   General: She is not in acute distress. ?   Appearance: She is well-developed. She is obese.  ?HENT:  ?   Head: Normocephalic.  ?   Right Ear: External ear normal.  ?   Left Ear: External ear normal.  ?   Nose: Nose normal.  ?Eyes:  ?   General:     ?   Right eye: No discharge.     ?   Left eye: No discharge.  ?   Conjunctiva/sclera: Conjunctivae normal.  ?   Pupils: Pupils are equal, round, and reactive to light.  ?Neck:  ?   Thyroid: No thyromegaly.  ?   Vascular: No JVD.  ?   Trachea: No tracheal deviation.  ?Cardiovascular:  ?   Rate and Rhythm: Normal rate and regular rhythm.  ?   Heart sounds:  Normal heart sounds.  ?Pulmonary:  ?   Effort: No respiratory distress.  ?   Breath sounds: No stridor. No wheezing.  ?Abdominal:  ?   General: Bowel sounds are normal. There is no distension.  ?   Palpations: Abdomen is soft. There is no mass.  ?   Tenderness: There is no abdominal tenderness. There is no guarding or rebound.  ?Musculoskeletal:     ?   General: No tenderness.  ?   Cervical back: Normal range of motion and neck supple. No rigidity.  ?Lymphadenopathy:  ?   Cervical: No cervical adenopathy.  ?Skin: ?   Findings: No erythema or rash.  ?Neurological:  ?   Cranial Nerves: No cranial nerve deficit.  ?   Motor: No abnormal muscle tone.  ?   Coordination: Coordination normal.  ?   Deep Tendon Reflexes: Reflexes normal.   ?Psychiatric:     ?   Behavior: Behavior normal.     ?   Thought Content: Thought content normal.     ?   Judgment: Judgment normal.  ? ? ?Lab Results  ?Component Value Date  ? WBC 7.0 09/24/2021  ? HGB 12.6 09/24/2021  ? HCT 38.3 09/24/2021  ? PLT 342.0 09/24/2021  ? GLUCOSE 106 (H) 09/24/2021  ? CHOL 165 06/23/2020  ? TRIG 70.0 06/23/2020  ? HDL 58.90 06/23/2020  ? LDLDIRECT 139.0 04/07/2009  ? [place] 92 06/23/2020  ? ALT 17 09/24/2021  ? AST 18 09/24/2021  ? NA 138 09/24/2021  ? K 3.9 09/24/2021  ? CL 100 09/24/2021  ? CREATININE 0.83 09/24/2021  ? BUN 10 09/24/2021  ? CO2 29 09/24/2021  ? TSH 2.18 07/18/2020  ? INR 1.0 09/24/2021  ? ? ?CT Super D Chest Wo Contrast ? ?Result Date: 12/29/2021 ?CLINICAL DATA:  Follow-up of left lower lobe pulmonary nodule. EXAM: CT CHEST WITHOUT CONTRAST TECHNIQUE: Multidetector CT imaging of the chest was performed using thin slice collimation for electromagnetic bronchoscopy planning purposes, without intravenous contrast. RADIATION DOSE REDUCTION: This exam was performed according to the departmental dose-optimization program which includes automated exposure control, adjustment of the mA and/or kV according to patient size and/or use of iterative reconstruction technique. COMPARISON:  06/11/2021 CT and 07/01/2021 PET FINDINGS: Cardiovascular: Aortic atherosclerosis. Normal heart size, without pericardial effusion. Mediastinum/Nodes: No mediastinal or definite hilar adenopathy, given limitations of unenhanced CT. Esophageal fluid level on 53/2. Lungs/Pleura: No pleural fluid. Right base scarring. Scattered tiny pulmonary nodules are unchanged and marked on series 8. The previously described pleural-based left lower lobe irregular density is decreased. Example 1.5 x 1.0 cm on 80/8 versus 2.8 x 1.0 cm on the prior diagnostic CT. Upper Abdomen: Normal imaged portions of the liver, spleen, stomach, pancreas, adrenal glands, kidneys. Cholecystectomy. Musculoskeletal: Accentuation of  expected thoracic kyphosis. IMPRESSION: The left lower lobe pleural-based density is decreased, most consistent with resolving infection/inflammation. Residual relatively linear density likely represents impacted mucus. Tiny bilateral pulmonary nodules are similar to on the prior exam, favored to be benign. Esophageal air fluid level suggests dysmotility or gastroesophageal reflux. Aortic Atherosclerosis (ICD10-I70.0). Electronically Signed   By: Abigail Miyamoto M.D.   On: 12/29/2021 12:22  ? ? ?Assessment & Plan:  ? ?Problem List Items Addressed This Visit   ? ? C. difficile colitis  ?  Prevention discussed.  Using a probiotic ? ?  ?  ? Chronic sinusitis, unspecified  ?  Appointment with Dr. Redmond Baseman is pending ?The patient was given a prescription for 1 week  of Augmentin by Dr. Lamonte Sakai.  She started to take it less than a week ago.  She is better but not well.  She would like to have another 2 weeks.  An additional prescription was emailed.  Use a probiotic ?Risk of diarrhea discussed. ?Diflucan given to take if needed ? ?  ?  ? Vaginal yeast infection  ?  Diflucan if needed ?Use a probiotic ? ?  ?  ?  ? ? ?Meds ordered this encounter  ?Medications  ? DISCONTD: amoxicillin-clavulanate (AUGMENTIN) 875-125 MG tablet  ?  Sig: Take 1 tablet by mouth 2 (two) times daily.  ?  Dispense:  14 tablet  ?  Refill:  0  ? fluconazole (DIFLUCAN) 150 MG tablet  ?  Sig: Take 1 tablet (150 mg total) by mouth once for 1 dose.  ?  Dispense:  1 tablet  ?  Refill:  1  ? Probiotic Product (ALIGN) 4 MG CAPS  ?  Sig: Take 1 capsule (4 mg total) by mouth daily.  ?  Dispense:  30 capsule  ?  Refill:  1  ?  ? ? ?Follow-up: Return in about 3 months (around 04/09/2022) for Wellness Exam. ? ?Walker Kehr, MD ?

## 2022-01-11 ENCOUNTER — Other Ambulatory Visit: Payer: Self-pay | Admitting: Emergency Medicine

## 2022-01-15 ENCOUNTER — Encounter: Payer: Self-pay | Admitting: Internal Medicine

## 2022-01-15 ENCOUNTER — Ambulatory Visit (INDEPENDENT_AMBULATORY_CARE_PROVIDER_SITE_OTHER): Payer: Medicare Other | Admitting: Internal Medicine

## 2022-01-15 DIAGNOSIS — J322 Chronic ethmoidal sinusitis: Secondary | ICD-10-CM

## 2022-01-15 DIAGNOSIS — A0472 Enterocolitis due to Clostridium difficile, not specified as recurrent: Secondary | ICD-10-CM | POA: Diagnosis not present

## 2022-01-15 DIAGNOSIS — B3731 Acute candidiasis of vulva and vagina: Secondary | ICD-10-CM | POA: Insufficient documentation

## 2022-01-15 MED ORDER — AMOXICILLIN-POT CLAVULANATE 875-125 MG PO TABS: 1.0000 | ORAL_TABLET | Freq: Two times a day (BID) | ORAL | 0 refills | Status: DC

## 2022-01-15 MED ORDER — FLUCONAZOLE 150 MG PO TABS: 150.0000 mg | ORAL_TABLET | Freq: Once | ORAL | 1 refills | Status: AC

## 2022-01-15 NOTE — Progress Notes (Signed)
? ?Subjective:  ?Patient ID: [name]. Schrieber, female    DOB: 02/23/1951  Age: 71 y.o. MRN: [id number] ? ?CC: Follow-up (Requesting refill on amox) ? ? ?HPI ?[name]. Daddario presents for ongoing sinusitis - [name] is better on Augmentin (Dr Lamonte Sakai gave 7 days a while ago but she started it 1 wk ago) but not well yet. No diarrhea. Appt w/Dr Redmond Baseman (ENT) is pending. ? ?Outpatient Medications Prior to Visit  ?Medication Sig Dispense Refill  ? Cholecalciferol (VITAMIN D3) 2000 units capsule Take 1 capsule (2,000 Units total) by mouth daily. 100 capsule 3  ? Probiotic Product (ALIGN) 4 MG CAPS Take 1 capsule (4 mg total) by mouth daily. 30 capsule 1  ? vancomycin (VANCOCIN) 125 MG capsule Re-start Vancomycin: 125 mg po qid x 2 weeks, then 125 mg po bid x 1 week, then 125 mg po 3 times a week x 4 weeks; then stop. (Patient not taking: Reported on 01/08/2022) 82 capsule 0  ? amoxicillin-clavulanate (AUGMENTIN) 875-125 MG tablet Take 1 tablet by mouth 2 (two) times daily. (Patient not taking: Reported on 01/15/2022) 14 tablet 0  ? ?No facility-administered medications prior to visit.  ? ? ?ROS: ?Review of Systems  ?Constitutional:  Positive for fatigue. Negative for activity change, appetite change, chills and unexpected weight change.  ?HENT:  Positive for dental problem, postnasal drip, rhinorrhea, sinus pressure and sore throat. Negative for congestion, mouth sores and nosebleeds.   ?Eyes:  Negative for visual disturbance.  ?Respiratory:  Negative for cough and chest tightness.   ?Gastrointestinal:  Negative for abdominal pain and nausea.  ?Genitourinary:  Negative for difficulty urinating, frequency and vaginal pain.  ?Musculoskeletal:  Negative for back pain and gait problem.  ?Skin:  Negative for pallor and rash.  ?Neurological:  Negative for dizziness, tremors, weakness, numbness and headaches.  ?Psychiatric/Behavioral:  Negative for confusion and sleep disturbance.   ? ?Objective:  ?BP 138/80   Pulse 81   Temp 98.2  ?F (36.8 ?C) (Oral)   Wt 162 lb 9.6 oz (73.8 kg)   LMP  (LMP Unknown)   SpO2 97%   BMI 27.06 kg/m?  ? ?BP Readings from Last 3 Encounters:  ?01/15/22 138/80  ?01/08/22 128/70  ?12/31/21 118/80  ? ? ?Wt Readings from Last 3 Encounters:  ?01/15/22 162 lb 9.6 oz (73.8 kg)  ?01/08/22 162 lb (73.5 kg)  ?12/31/21 162 lb 12.8 oz (73.8 kg)  ? ? ?Physical Exam ?Constitutional:   ?   General: She is not in acute distress. ?   Appearance: She is well-developed. She is obese.  ?HENT:  ?   Head: Normocephalic.  ?   Right Ear: External ear normal.  ?   Left Ear: External ear normal.  ?   Nose: Nose normal.  ?Eyes:  ?   General:     ?   Right eye: No discharge.     ?   Left eye: No discharge.  ?   Conjunctiva/sclera: Conjunctivae normal.  ?   Pupils: Pupils are equal, round, and reactive to light.  ?Neck:  ?   Thyroid: No thyromegaly.  ?   Vascular: No JVD.  ?   Trachea: No tracheal deviation.  ?Cardiovascular:  ?   Rate and Rhythm: Normal rate and regular rhythm.  ?   Heart sounds: Normal heart sounds.  ?Pulmonary:  ?   Effort: No respiratory distress.  ?   Breath sounds: No stridor. No wheezing.  ?Abdominal:  ?   General: Bowel sounds  are normal. There is no distension.  ?   Palpations: Abdomen is soft. There is no mass.  ?   Tenderness: There is no abdominal tenderness. There is no guarding or rebound.  ?Musculoskeletal:     ?   General: No tenderness.  ?   Cervical back: Normal range of motion and neck supple. No rigidity.  ?Lymphadenopathy:  ?   Cervical: No cervical adenopathy.  ?Skin: ?   Findings: No erythema or rash.  ?Neurological:  ?   Mental Status: She is oriented to person, place, and time.  ?   Cranial Nerves: No cranial nerve deficit.  ?   Motor: No abnormal muscle tone.  ?   Coordination: Coordination normal.  ?   Deep Tendon Reflexes: Reflexes normal.  ?Psychiatric:     ?   Behavior: Behavior normal.     ?   Thought Content: Thought content normal.     ?   Judgment: Judgment normal.  ? ? ?Lab Results   ?Component Value Date  ? WBC 7.0 09/24/2021  ? HGB 12.6 09/24/2021  ? HCT 38.3 09/24/2021  ? PLT 342.0 09/24/2021  ? GLUCOSE 106 (H) 09/24/2021  ? CHOL 165 06/23/2020  ? TRIG 70.0 06/23/2020  ? HDL 58.90 06/23/2020  ? LDLDIRECT 139.0 04/07/2009  ? [place] 92 06/23/2020  ? ALT 17 09/24/2021  ? AST 18 09/24/2021  ? NA 138 09/24/2021  ? K 3.9 09/24/2021  ? CL 100 09/24/2021  ? CREATININE 0.83 09/24/2021  ? BUN 10 09/24/2021  ? CO2 29 09/24/2021  ? TSH 2.18 07/18/2020  ? INR 1.0 09/24/2021  ? ? ?CT Super D Chest Wo Contrast ? ?Result Date: 12/29/2021 ?CLINICAL DATA:  Follow-up of left lower lobe pulmonary nodule. EXAM: CT CHEST WITHOUT CONTRAST TECHNIQUE: Multidetector CT imaging of the chest was performed using thin slice collimation for electromagnetic bronchoscopy planning purposes, without intravenous contrast. RADIATION DOSE REDUCTION: This exam was performed according to the departmental dose-optimization program which includes automated exposure control, adjustment of the mA and/or kV according to patient size and/or use of iterative reconstruction technique. COMPARISON:  06/11/2021 CT and 07/01/2021 PET FINDINGS: Cardiovascular: Aortic atherosclerosis. Normal heart size, without pericardial effusion. Mediastinum/Nodes: No mediastinal or definite hilar adenopathy, given limitations of unenhanced CT. Esophageal fluid level on 53/2. Lungs/Pleura: No pleural fluid. Right base scarring. Scattered tiny pulmonary nodules are unchanged and marked on series 8. The previously described pleural-based left lower lobe irregular density is decreased. Example 1.5 x 1.0 cm on 80/8 versus 2.8 x 1.0 cm on the prior diagnostic CT. Upper Abdomen: Normal imaged portions of the liver, spleen, stomach, pancreas, adrenal glands, kidneys. Cholecystectomy. Musculoskeletal: Accentuation of expected thoracic kyphosis. IMPRESSION: The left lower lobe pleural-based density is decreased, most consistent with resolving infection/inflammation.  Residual relatively linear density likely represents impacted mucus. Tiny bilateral pulmonary nodules are similar to on the prior exam, favored to be benign. Esophageal air fluid level suggests dysmotility or gastroesophageal reflux. Aortic Atherosclerosis (ICD10-I70.0). Electronically Signed   By: Abigail Miyamoto M.D.   On: 12/29/2021 12:22  ? ? ?Assessment & Plan:  ? ?Problem List Items Addressed This Visit   ? ? C. difficile colitis  ?  On a probiotic ? ?  ?  ? Relevant Medications  ? fluconazole (DIFLUCAN) 150 MG tablet  ? Chronic sinusitis, unspecified  ?  [name] is better on Augmentin (Dr Lamonte Sakai gave 7 days a while ago but she started it 1 wk ago) but not well yet. No  diarrhea. Appt w/Dr Redmond Baseman (ENT) is pending. ?Will give a Rx for Augmentin x14 d for a total of 3 wks if olerated. ? Potential benefits of a longer ABX  use as well as potential risks  and complications were explained to the patient and were aknowledged. ? ? ?  ?  ? Relevant Medications  ? amoxicillin-clavulanate (AUGMENTIN) 875-125 MG tablet  ? fluconazole (DIFLUCAN) 150 MG tablet  ? Vaginal yeast infection  ?  May get w/abx treatment ?Diflucan po if needed ? ?  ?  ? Relevant Medications  ? fluconazole (DIFLUCAN) 150 MG tablet  ?  ? ? ?Meds ordered this encounter  ?Medications  ? amoxicillin-clavulanate (AUGMENTIN) 875-125 MG tablet  ?  Sig: Take 1 tablet by mouth 2 (two) times daily.  ?  Dispense:  28 tablet  ?  Refill:  0  ? fluconazole (DIFLUCAN) 150 MG tablet  ?  Sig: Take 1 tablet (150 mg total) by mouth once for 1 dose.  ?  Dispense:  1 tablet  ?  Refill:  1  ?  ? ? ?Follow-up: Return in about 2 months (around 03/17/2022) for a follow-up visit. ? ?Walker Kehr, MD ?

## 2022-01-15 NOTE — Assessment & Plan Note (Signed)
On a probiotic ?

## 2022-01-15 NOTE — Assessment & Plan Note (Addendum)
[name] is better on Augmentin (Dr Lamonte Sakai gave 7 days a while ago but she started it 1 wk ago) but not well yet. No diarrhea. Appt w/Dr Redmond Baseman (ENT) is pending. ?Will give a Rx for Augmentin x14 d for a total of 3 wks if olerated. ? Potential benefits of a longer ABX use as well as potential risks (ie C diff)  and complications were explained to the patient and were aknowledged. ? ? ?

## 2022-01-15 NOTE — Assessment & Plan Note (Signed)
May get w/abx treatment ?Diflucan po if needed ?

## 2022-01-18 NOTE — Assessment & Plan Note (Signed)
Diflucan if needed ?Use a probiotic ?

## 2022-01-18 NOTE — Assessment & Plan Note (Signed)
Appointment with Dr. Redmond Baseman is pending ?The patient was given a prescription for 1 week of Augmentin by Dr. Lamonte Sakai.  She started to take it less than a week ago.  She is better but not well.  She would like to have another 2 weeks.  An additional prescription was emailed.  Use a probiotic ?Risk of diarrhea discussed. ?Diflucan given to take if needed ?

## 2022-01-18 NOTE — Assessment & Plan Note (Signed)
Prevention discussed.  Using a probiotic ?

## 2022-01-27 ENCOUNTER — Ambulatory Visit: Payer: Medicare Other | Admitting: Internal Medicine

## 2022-01-28 ENCOUNTER — Encounter: Payer: Self-pay | Admitting: Internal Medicine

## 2022-01-28 ENCOUNTER — Ambulatory Visit (INDEPENDENT_AMBULATORY_CARE_PROVIDER_SITE_OTHER): Payer: Medicare Other | Admitting: Internal Medicine

## 2022-01-28 DIAGNOSIS — J0100 Acute maxillary sinusitis, unspecified: Secondary | ICD-10-CM | POA: Diagnosis not present

## 2022-01-28 MED ORDER — AMOXICILLIN-POT CLAVULANATE 875-125 MG PO TABS: 1.0000 | ORAL_TABLET | Freq: Two times a day (BID) | ORAL | 0 refills | Status: DC

## 2022-01-28 NOTE — Progress Notes (Signed)
Patient ID: [name], female   DOB: 02-26-1951, 71 y.o.   MRN: [id number] ? ? ? ?    Chief Complaint: follow up sinus symptoms ? ?     HPI:  [name] is a 71 y.o. female  Here with 2-3 days acute onset fever, facial pain, pressure, headache, general weakness and malaise, and greenish d/c, with mild ST and cough, but pt denies chest pain, wheezing, increased sob or doe, orthopnea, PND, increased LE swelling, palpitations, dizziness or syncope.  Has f/u appt with ENT in June 2023.  Marland Kitchen Pt denies polydipsia, polyuria, or new focal neuro s/s.    Pt denies recent wt loss, night sweats, loss of appetite, or other constitutional symptoms ?      ?Wt Readings from Last 3 Encounters:  ?02/03/22 161 lb (73 kg)  ?01/28/22 162 lb (73.5 kg)  ?01/15/22 162 lb 9.6 oz (73.8 kg)  ? ?BP Readings from Last 3 Encounters:  ?02/03/22 118/80  ?01/28/22 138/80  ?01/15/22 138/80  ? ?      ?Past Medical History:  ?Diagnosis Date  ? Anemia   ? with a pregnancy   ? GERD (gastroesophageal reflux disease)   ? pt denies this dx   ? SVD (spontaneous vaginal delivery)   ? x 5  ? ?Past Surgical History:  ?Procedure Laterality Date  ? APPENDECTOMY    ? CHOLECYSTECTOMY    ? COLONOSCOPY  08/23/2006  ? DB- normal   ? DIRECT LARYNGOSCOPY N/A 01/14/2019  ? Procedure: DIRECT LARYNGOSCOPY;  Surgeon: Melida Quitter, MD;  Location: [facility];  Service: ENT;  Laterality: N/A;  ? FOREIGN BODY REMOVAL ESOPHAGEAL  01/14/2019  ? Procedure: Removal Foreign Body Esophageal;  Surgeon: Melida Quitter, MD;  Location: [facility];  Service: ENT;;  ? TONSILLECTOMY    ? ? reports that she has never smoked. She has never used smokeless tobacco. She reports that she does not drink alcohol and does not use drugs. ?family history includes Aneurysm in her father; Cancer (age of onset: 33) in her brother. ?No Known Allergies ?Current Outpatient Medications on File Prior to Visit  ?Medication Sig Dispense Refill  ? Cholecalciferol (VITAMIN D3) 2000 units capsule Take 1 capsule  (2,000 Units total) by mouth daily. 100 capsule 3  ? Probiotic Product (ALIGN) 4 MG CAPS Take 1 capsule (4 mg total) by mouth daily. 30 capsule 1  ? ?No current facility-administered medications on file prior to visit.  ? ?     ROS:  All others reviewed and negative. ? ?Objective  ? ?     PE:  BP 138/80 (BP Location: Right Arm, Patient Position: Sitting, Cuff Size: Normal)   Pulse 71   Temp 98 ?F (36.7 ?C) (Oral)   Ht '5\' 5"'$  (1.651 m)   Wt 162 lb (73.5 kg)   LMP  (LMP Unknown)   SpO2 96%   BMI 26.96 kg/m?  ? ?              Constitutional: Pt appears in NAD ?              HENT: Head: NCAT.  ?              Right Ear: External ear normal.   ?              Left Ear: External ear normal.  Bilat tm's with mild erythema.  Max sinus areas mild tender.  Pharynx with mild erythema, no exudate ?  Eyes: . Pupils are equal, round, and reactive to light. Conjunctivae and EOM are normal ?              Nose: without d/c or deformity ?              Neck: Neck supple. Gross normal ROM ?              Cardiovascular: Normal rate and regular rhythm.   ?              Pulmonary/Chest: Effort normal and breath sounds without rales or wheezing.  ?               ?              Neurological: Pt is alert. At baseline orientation, motor grossly intact ?              Skin: Skin is warm. No rashes, no other new lesions, LE edema - none ?              Psychiatric: Pt behavior is normal without agitation  ? ?Micro: none ? ?Cardiac tracings I have personally interpreted today:  none ? ?Pertinent Radiological findings (summarize): none  ? ?Lab Results  ?Component Value Date  ? WBC 7.0 09/24/2021  ? HGB 12.6 09/24/2021  ? HCT 38.3 09/24/2021  ? PLT 342.0 09/24/2021  ? GLUCOSE 106 (H) 09/24/2021  ? CHOL 165 06/23/2020  ? TRIG 70.0 06/23/2020  ? HDL 58.90 06/23/2020  ? LDLDIRECT 139.0 04/07/2009  ? [place] 92 06/23/2020  ? ALT 17 09/24/2021  ? AST 18 09/24/2021  ? NA 138 09/24/2021  ? K 3.9 09/24/2021  ? CL 100 09/24/2021  ? CREATININE  0.83 09/24/2021  ? BUN 10 09/24/2021  ? CO2 29 09/24/2021  ? TSH 2.18 07/18/2020  ? INR 1.0 09/24/2021  ? ?Assessment/Plan:  ?[name] is a 71 y.o. White or Caucasian [1] female with  has a past medical history of Anemia, GERD (gastroesophageal reflux disease), and SVD (spontaneous vaginal delivery). ? ?Acute sinusitis ?Mild to mod, for antibx course x 7 days,  to f/u any worsening symptoms or concerns and ENT in June 2023 ? ?Followup: Return if symptoms worsen or fail to improve. ? ?Cathlean Cower, MD 02/03/2022 9:01 PM ?[facility] ?[place] ?Internal Medicine ?

## 2022-01-28 NOTE — Patient Instructions (Signed)
Please take all new medication as prescribed - the further augmentin for 1 wk only ? ?Please continue all other medications as before, and refills have been done if requested. ? ?Please have the pharmacy call with any other refills you may need. ? ?Please keep your appointments with your specialists as you may have planned ? ? ?

## 2022-02-03 ENCOUNTER — Encounter: Payer: Self-pay | Admitting: Internal Medicine

## 2022-02-03 ENCOUNTER — Ambulatory Visit (INDEPENDENT_AMBULATORY_CARE_PROVIDER_SITE_OTHER): Payer: Medicare Other | Admitting: Internal Medicine

## 2022-02-03 DIAGNOSIS — R432 Parageusia: Secondary | ICD-10-CM | POA: Diagnosis not present

## 2022-02-03 DIAGNOSIS — J322 Chronic ethmoidal sinusitis: Secondary | ICD-10-CM | POA: Diagnosis not present

## 2022-02-03 DIAGNOSIS — R9389 Abnormal findings on diagnostic imaging of other specified body structures: Secondary | ICD-10-CM

## 2022-02-03 DIAGNOSIS — A0472 Enterocolitis due to Clostridium difficile, not specified as recurrent: Secondary | ICD-10-CM | POA: Diagnosis not present

## 2022-02-03 MED ORDER — GUAIFENESIN ER 600 MG PO TB12: ORAL_TABLET | ORAL | 1 refills | Status: DC

## 2022-02-03 NOTE — Assessment & Plan Note (Signed)
Finish Augmentin ?See Dr Redmond Baseman ?Start Mucinex po ?

## 2022-02-03 NOTE — Assessment & Plan Note (Addendum)
No relapse ?Finish Augmentin. Use a probiotic ?

## 2022-02-03 NOTE — Progress Notes (Signed)
? ?Subjective:  ?Patient ID: [name], female    DOB: December 12, 1950  Age: 71 y.o. MRN: [id number] ? ?CC: No chief complaint on file. ? ? ?HPI ?[name] presents for sinusitis - finishing Augmentin x 3 weeks. Appt w/Dr Redmond Baseman is pending ?H/o C diff, abn chest CT ? ?Outpatient Medications Prior to Visit  ?Medication Sig Dispense Refill  ? amoxicillin-clavulanate (AUGMENTIN) 875-125 MG tablet Take 1 tablet by mouth 2 (two) times daily. 14 tablet 0  ? Cholecalciferol (VITAMIN D3) 2000 units capsule Take 1 capsule (2,000 Units total) by mouth daily. 100 capsule 3  ? Probiotic Product (ALIGN) 4 MG CAPS Take 1 capsule (4 mg total) by mouth daily. 30 capsule 1  ? ?No facility-administered medications prior to visit.  ? ? ?ROS: ?Review of Systems  ?Constitutional:  Negative for activity change, appetite change, chills, fatigue and unexpected weight change.  ?HENT:  Positive for sinus pressure. Negative for congestion and mouth sores.   ?Eyes:  Negative for visual disturbance.  ?Respiratory:  Positive for cough. Negative for chest tightness.   ?Gastrointestinal:  Negative for abdominal pain and nausea.  ?Genitourinary:  Negative for difficulty urinating, frequency and vaginal pain.  ?Musculoskeletal:  Negative for back pain and gait problem.  ?Skin:  Negative for pallor and rash.  ?Neurological:  Negative for dizziness, tremors, weakness, numbness and headaches.  ?Psychiatric/Behavioral:  Negative for confusion and sleep disturbance.   ? ?Objective:  ?BP 118/80 (BP Location: Left Arm, Patient Position: Sitting, Cuff Size: Normal)   Pulse 75   Temp 98.6 ?F (37 ?C) (Oral)   Ht '5\' 5"'$  (1.651 m)   Wt 161 lb (73 kg)   LMP  (LMP Unknown)   SpO2 96%   BMI 26.79 kg/m?  ? ?BP Readings from Last 3 Encounters:  ?02/03/22 118/80  ?01/28/22 138/80  ?01/15/22 138/80  ? ? ?Wt Readings from Last 3 Encounters:  ?02/03/22 161 lb (73 kg)  ?01/28/22 162 lb (73.5 kg)  ?01/15/22 162 lb 9.6 oz (73.8 kg)  ? ? ?Physical  Exam ?Constitutional:   ?   General: She is not in acute distress. ?   Appearance: She is well-developed. She is obese.  ?HENT:  ?   Head: Normocephalic.  ?   Right Ear: External ear normal.  ?   Left Ear: External ear normal.  ?   Nose: Nose normal.  ?Eyes:  ?   General:     ?   Right eye: No discharge.     ?   Left eye: No discharge.  ?   Conjunctiva/sclera: Conjunctivae normal.  ?   Pupils: Pupils are equal, round, and reactive to light.  ?Neck:  ?   Thyroid: No thyromegaly.  ?   Vascular: No JVD.  ?   Trachea: No tracheal deviation.  ?Cardiovascular:  ?   Rate and Rhythm: Normal rate and regular rhythm.  ?   Heart sounds: Normal heart sounds.  ?Pulmonary:  ?   Effort: No respiratory distress.  ?   Breath sounds: No stridor. No wheezing.  ?Abdominal:  ?   General: Bowel sounds are normal. There is no distension.  ?   Palpations: Abdomen is soft. There is no mass.  ?   Tenderness: There is no abdominal tenderness. There is no guarding or rebound.  ?Musculoskeletal:     ?   General: No tenderness.  ?   Cervical back: Normal range of motion and neck supple. No rigidity.  ?Lymphadenopathy:  ?  Cervical: No cervical adenopathy.  ?Skin: ?   Findings: No erythema or rash.  ?Neurological:  ?   Cranial Nerves: No cranial nerve deficit.  ?   Motor: No abnormal muscle tone.  ?   Coordination: Coordination normal.  ?   Deep Tendon Reflexes: Reflexes normal.  ?Psychiatric:     ?   Behavior: Behavior normal.     ?   Thought Content: Thought content normal.     ?   Judgment: Judgment normal.  ? ?Slightly swollen nasal lining ? ? ?Lab Results  ?Component Value Date  ? WBC 7.0 09/24/2021  ? HGB 12.6 09/24/2021  ? HCT 38.3 09/24/2021  ? PLT 342.0 09/24/2021  ? GLUCOSE 106 (H) 09/24/2021  ? CHOL 165 06/23/2020  ? TRIG 70.0 06/23/2020  ? HDL 58.90 06/23/2020  ? LDLDIRECT 139.0 04/07/2009  ? [place] 92 06/23/2020  ? ALT 17 09/24/2021  ? AST 18 09/24/2021  ? NA 138 09/24/2021  ? K 3.9 09/24/2021  ? CL 100 09/24/2021  ? CREATININE  0.83 09/24/2021  ? BUN 10 09/24/2021  ? CO2 29 09/24/2021  ? TSH 2.18 07/18/2020  ? INR 1.0 09/24/2021  ? ? ?CT Super D Chest Wo Contrast ? ?Result Date: 12/29/2021 ?CLINICAL DATA:  Follow-up of left lower lobe pulmonary nodule. EXAM: CT CHEST WITHOUT CONTRAST TECHNIQUE: Multidetector CT imaging of the chest was performed using thin slice collimation for electromagnetic bronchoscopy planning purposes, without intravenous contrast. RADIATION DOSE REDUCTION: This exam was performed according to the departmental dose-optimization program which includes automated exposure control, adjustment of the mA and/or kV according to patient size and/or use of iterative reconstruction technique. COMPARISON:  06/11/2021 CT and 07/01/2021 PET FINDINGS: Cardiovascular: Aortic atherosclerosis. Normal heart size, without pericardial effusion. Mediastinum/Nodes: No mediastinal or definite hilar adenopathy, given limitations of unenhanced CT. Esophageal fluid level on 53/2. Lungs/Pleura: No pleural fluid. Right base scarring. Scattered tiny pulmonary nodules are unchanged and marked on series 8. The previously described pleural-based left lower lobe irregular density is decreased. Example 1.5 x 1.0 cm on 80/8 versus 2.8 x 1.0 cm on the prior diagnostic CT. Upper Abdomen: Normal imaged portions of the liver, spleen, stomach, pancreas, adrenal glands, kidneys. Cholecystectomy. Musculoskeletal: Accentuation of expected thoracic kyphosis. IMPRESSION: The left lower lobe pleural-based density is decreased, most consistent with resolving infection/inflammation. Residual relatively linear density likely represents impacted mucus. Tiny bilateral pulmonary nodules are similar to on the prior exam, favored to be benign. Esophageal air fluid level suggests dysmotility or gastroesophageal reflux. Aortic Atherosclerosis (ICD10-I70.0). Electronically Signed   By: Abigail Miyamoto M.D.   On: 12/29/2021 12:22  ? ? ?Assessment & Plan:  ? ?Problem List Items  Addressed This Visit   ? ? Loss of taste  ?  May be better ? ?  ?  ? Abnormal CT of the chest  ?  F/u w/Dr Lamonte Sakai ?Start Mucinex po ? ?  ?  ? C. difficile colitis  ?  No relapse ?Finish Augmentin. Use a probiotic ?  ?  ? Chronic sinusitis, unspecified  ?  Finish Augmentin ?See Dr Redmond Baseman ?Start Mucinex po ? ?  ?  ? Relevant Medications  ? guaiFENesin (MUCINEX) 600 MG 12 hr tablet  ?  ? ? ?Meds ordered this encounter  ?Medications  ? guaiFENesin (MUCINEX) 600 MG 12 hr tablet  ?  Sig: Use one bid  ?  Dispense:  100 tablet  ?  Refill:  1  ?  ? ? ?Follow-up: Return in about  3 months (around 05/06/2022) for a follow-up visit. ? ?Walker Kehr, MD ?

## 2022-02-03 NOTE — Assessment & Plan Note (Signed)
F/u w/Dr Lamonte Sakai ?Start Mucinex po ?

## 2022-02-03 NOTE — Assessment & Plan Note (Addendum)
Mild to mod, for antibx course x 7 days,  to f/u any worsening symptoms or concerns and ENT in June 2023 ?

## 2022-02-03 NOTE — Assessment & Plan Note (Signed)
May be better ?

## 2022-02-07 ENCOUNTER — Other Ambulatory Visit: Payer: Self-pay | Admitting: Internal Medicine

## 2022-02-08 ENCOUNTER — Telehealth: Payer: Self-pay

## 2022-02-08 NOTE — Telephone Encounter (Signed)
Pt is requesting to have 3 more tab of amoxicillin-clavulanate (AUGMENTIN) 875-125 MG tablet until she see ENT. Pt reported that sxs have improved ? ? ? Pharmacy: ?[id number]- [place], [place] - [address] ? ? ? ?

## 2022-02-08 NOTE — Telephone Encounter (Signed)
No, we should be ok with the original rx.  Ok to be off the antibiotic for 3 dose when see the ENT as then we can tell if will stay improved ? ?Also, if I recall, the medication for her was an extension of a previous rx, so taking even longer takes risk such as developing c diff colitis which can be severe and risky ?

## 2022-02-10 DIAGNOSIS — J329 Chronic sinusitis, unspecified: Secondary | ICD-10-CM | POA: Diagnosis not present

## 2022-02-10 DIAGNOSIS — J3489 Other specified disorders of nose and nasal sinuses: Secondary | ICD-10-CM | POA: Diagnosis not present

## 2022-02-10 DIAGNOSIS — J320 Chronic maxillary sinusitis: Secondary | ICD-10-CM | POA: Diagnosis not present

## 2022-02-16 NOTE — Telephone Encounter (Signed)
Pt is aware..came in and saw Dr. Alain Marion...[name]

## 2022-02-25 DIAGNOSIS — J320 Chronic maxillary sinusitis: Secondary | ICD-10-CM | POA: Diagnosis not present

## 2022-03-05 ENCOUNTER — Other Ambulatory Visit: Payer: Self-pay | Admitting: Otolaryngology

## 2022-03-05 DIAGNOSIS — J329 Chronic sinusitis, unspecified: Secondary | ICD-10-CM | POA: Diagnosis not present

## 2022-03-05 DIAGNOSIS — J320 Chronic maxillary sinusitis: Secondary | ICD-10-CM | POA: Diagnosis not present

## 2022-03-23 DIAGNOSIS — J320 Chronic maxillary sinusitis: Secondary | ICD-10-CM | POA: Diagnosis not present

## 2022-05-04 ENCOUNTER — Encounter: Payer: Self-pay | Admitting: Internal Medicine

## 2022-05-04 ENCOUNTER — Ambulatory Visit (INDEPENDENT_AMBULATORY_CARE_PROVIDER_SITE_OTHER): Payer: Medicare Other | Admitting: Internal Medicine

## 2022-05-04 DIAGNOSIS — R053 Chronic cough: Secondary | ICD-10-CM | POA: Diagnosis not present

## 2022-05-04 DIAGNOSIS — J322 Chronic ethmoidal sinusitis: Secondary | ICD-10-CM

## 2022-05-04 DIAGNOSIS — R432 Parageusia: Secondary | ICD-10-CM

## 2022-05-04 DIAGNOSIS — D126 Benign neoplasm of colon, unspecified: Secondary | ICD-10-CM

## 2022-05-04 DIAGNOSIS — R9389 Abnormal findings on diagnostic imaging of other specified body structures: Secondary | ICD-10-CM

## 2022-05-04 DIAGNOSIS — K635 Polyp of colon: Secondary | ICD-10-CM | POA: Insufficient documentation

## 2022-05-04 DIAGNOSIS — J0100 Acute maxillary sinusitis, unspecified: Secondary | ICD-10-CM

## 2022-05-04 NOTE — Assessment & Plan Note (Signed)
S/p Nasal endoscopy with debridement by Dr Redmond Baseman on 03/23/22. Better

## 2022-05-04 NOTE — Assessment & Plan Note (Signed)
Dr Silverio Decamp Last colon in 07/2022. Due in 3 years

## 2022-05-04 NOTE — Progress Notes (Signed)
Subjective:  Patient ID: [name], female    DOB: [birth year]  Age: 71 y.o. MRN: [id number]  CC: 15mofollow up (Patient not sure reason for todays visit)   HPI [name] presents for sinusitis, chronic cough. Cough may be a little better s/p Nasal endoscopy with debridement by Dr BRedmond Basemanon 03/23/22 Pt had a well w/Dr TRonita Hipps Outpatient Medications Prior to Visit  Medication Sig Dispense Refill   Cholecalciferol (VITAMIN D3) 2000 units capsule Take 1 capsule (2,000 Units total) by mouth daily. 100 capsule 3   guaiFENesin (MUCINEX) 600 MG 12 hr tablet Use one bid 100 tablet 1   Probiotic Product (ALIGN) 4 MG CAPS Take 1 capsule (4 mg total) by mouth daily. 30 capsule 1   amoxicillin-clavulanate (AUGMENTIN) 875-125 MG tablet Take 1 tablet by mouth 2 (two) times daily. 14 tablet 0   No facility-administered medications prior to visit.    ROS: Review of Systems  Constitutional:  Negative for activity change, appetite change, chills, fatigue and unexpected weight change.  HENT:  Positive for congestion, postnasal drip and rhinorrhea. Negative for mouth sores, sinus pressure and sinus pain.   Eyes:  Negative for visual disturbance.  Respiratory:  Positive for cough. Negative for chest tightness.   Gastrointestinal:  Negative for abdominal pain and nausea.  Genitourinary:  Negative for difficulty urinating, frequency and vaginal pain.  Musculoskeletal:  Negative for back pain and gait problem.  Skin:  Negative for pallor and rash.  Neurological:  Negative for dizziness, tremors, weakness, numbness and headaches.  Psychiatric/Behavioral:  Negative for confusion and sleep disturbance.     Objective:  BP 134/82 (BP Location: Left Arm, Patient Position: Sitting, Cuff Size: Large)   Pulse 65   Temp 98.2 F (36.8 C) (Oral)   Ht '5\' 5"'$  (1.651 m)   Wt 159 lb (72.1 kg)   LMP  (LMP Unknown)   SpO2 98%   BMI 26.46 kg/m   BP Readings from Last 3 Encounters:  05/04/22  134/82  02/03/22 118/80  01/28/22 138/80    Wt Readings from Last 3 Encounters:  05/04/22 159 lb (72.1 kg)  02/03/22 161 lb (73 kg)  01/28/22 162 lb (73.5 kg)    Physical Exam Constitutional:      General: She is not in acute distress.    Appearance: She is well-developed. She is obese.  HENT:     Head: Normocephalic.     Right Ear: External ear normal.     Left Ear: External ear normal.     Nose: Nose normal.  Eyes:     General:        Right eye: No discharge.        Left eye: No discharge.     Conjunctiva/sclera: Conjunctivae normal.     Pupils: Pupils are equal, round, and reactive to light.  Neck:     Thyroid: No thyromegaly.     Vascular: No JVD.     Trachea: No tracheal deviation.  Cardiovascular:     Rate and Rhythm: Normal rate and regular rhythm.     Heart sounds: Normal heart sounds.  Pulmonary:     Effort: No respiratory distress.     Breath sounds: No stridor. No wheezing.  Abdominal:     General: Bowel sounds are normal. There is no distension.     Palpations: Abdomen is soft. There is no mass.     Tenderness: There is no abdominal tenderness. There is no guarding or rebound.  Musculoskeletal:        General: No tenderness.     Cervical back: Normal range of motion and neck supple. No rigidity.  Lymphadenopathy:     Cervical: No cervical adenopathy.  Skin:    Findings: No erythema or rash.  Neurological:     Cranial Nerves: No cranial nerve deficit.     Motor: No abnormal muscle tone.     Coordination: Coordination normal.     Deep Tendon Reflexes: Reflexes normal.  Psychiatric:        Behavior: Behavior normal.        Thought Content: Thought content normal.        Judgment: Judgment normal.   Looks better  Lab Results  Component Value Date   WBC 7.0 09/24/2021   HGB 12.6 09/24/2021   HCT 38.3 09/24/2021   PLT 342.0 09/24/2021   GLUCOSE 106 (H) 09/24/2021   CHOL 165 06/23/2020   TRIG 70.0 06/23/2020   HDL 58.90 06/23/2020   LDLDIRECT  139.0 04/07/2009   LDLCALC 92 06/23/2020   ALT 17 09/24/2021   AST 18 09/24/2021   NA 138 09/24/2021   K 3.9 09/24/2021   CL 100 09/24/2021   CREATININE 0.83 09/24/2021   BUN 10 09/24/2021   CO2 29 09/24/2021   TSH 2.18 07/18/2020   INR 1.0 09/24/2021    CT Super D Chest Wo Contrast  Result Date: 12/29/2021 CLINICAL DATA:  Follow-up of left lower lobe pulmonary nodule. EXAM: CT CHEST WITHOUT CONTRAST TECHNIQUE: Multidetector CT imaging of the chest was performed using thin slice collimation for electromagnetic bronchoscopy planning purposes, without intravenous contrast. RADIATION DOSE REDUCTION: This exam was performed according to the departmental dose-optimization program which includes automated exposure control, adjustment of the mA and/or kV according to patient size and/or use of iterative reconstruction technique. COMPARISON:  06/11/2021 CT and 07/01/2021 PET FINDINGS: Cardiovascular: Aortic atherosclerosis. Normal heart size, without pericardial effusion. Mediastinum/Nodes: No mediastinal or definite hilar adenopathy, given limitations of unenhanced CT. Esophageal fluid level on 53/2. Lungs/Pleura: No pleural fluid. Right base scarring. Scattered tiny pulmonary nodules are unchanged and marked on series 8. The previously described pleural-based left lower lobe irregular density is decreased. Example 1.5 x 1.0 cm on 80/8 versus 2.8 x 1.0 cm on the prior diagnostic CT. Upper Abdomen: Normal imaged portions of the liver, spleen, stomach, pancreas, adrenal glands, kidneys. Cholecystectomy. Musculoskeletal: Accentuation of expected thoracic kyphosis. IMPRESSION: The left lower lobe pleural-based density is decreased, most consistent with resolving infection/inflammation. Residual relatively linear density likely represents impacted mucus. Tiny bilateral pulmonary nodules are similar to on the prior exam, favored to be benign. Esophageal air fluid level suggests dysmotility or gastroesophageal  reflux. Aortic Atherosclerosis (ICD10-I70.0). Electronically Signed   By: Abigail Miyamoto M.D.   On: 12/29/2021 12:22    Assessment & Plan:   Problem List Items Addressed This Visit     Abnormal CT of the chest    F/u w/Dr Byrum Cough may be a little better s/p Nasal endoscopy with debridement by Dr Redmond Baseman on 03/23/22      Acute sinusitis    S/p Nasal endoscopy with debridement by Dr Redmond Baseman on 03/23/22      Chronic sinusitis, unspecified    S/p Nasal endoscopy with debridement by Dr Redmond Baseman on 03/23/22. Better       Colon polyps    Dr Silverio Decamp Last colon in 07/2022. Due in 3 years      Cough    F/u w/Dr Lamonte Sakai Cough may  be a little better s/p Nasal endoscopy with debridement by Dr Redmond Baseman on 03/23/22      Loss of taste      No orders of the defined types were placed in this encounter.     Follow-up: Return in about 3 months (around 08/04/2022) for a follow-up visit.  Walker Kehr, MD

## 2022-05-04 NOTE — Assessment & Plan Note (Signed)
F/u w/Dr Lamonte Sakai Cough may be a little better s/p Nasal endoscopy with debridement by Dr Redmond Baseman on 03/23/22

## 2022-05-04 NOTE — Assessment & Plan Note (Signed)
S/p Nasal endoscopy with debridement by Dr Redmond Baseman on 03/23/22

## 2022-05-25 DIAGNOSIS — J320 Chronic maxillary sinusitis: Secondary | ICD-10-CM | POA: Diagnosis not present

## 2022-06-22 ENCOUNTER — Encounter: Payer: Self-pay | Admitting: Internal Medicine

## 2022-06-22 DIAGNOSIS — Z1231 Encounter for screening mammogram for malignant neoplasm of breast: Secondary | ICD-10-CM | POA: Diagnosis not present

## 2022-06-22 LAB — HM MAMMOGRAPHY

## 2022-06-23 ENCOUNTER — Telehealth: Payer: Self-pay | Admitting: Gastroenterology

## 2022-06-23 NOTE — Telephone Encounter (Signed)
Inbound call from patient stating that she would like a call back to discuss some concerns that she has about her health. Please advise.

## 2022-06-24 NOTE — Telephone Encounter (Signed)
Left message on machine to call back  

## 2022-06-25 NOTE — Telephone Encounter (Signed)
Patient returned your call, requesting a call back.   [phone]

## 2022-06-28 NOTE — Telephone Encounter (Signed)
Patient is returning your call. [phone] in case she does not answer her cell.

## 2022-06-28 NOTE — Telephone Encounter (Signed)
Line busy

## 2022-06-29 NOTE — Telephone Encounter (Addendum)
Patient called back states states is returning your call back. states she would like to speak to you as soon as possible.States she believe there is another polyp developing in her. Requesting a call  back on [phone]. Please call to advise.

## 2022-06-29 NOTE — Telephone Encounter (Signed)
The pt walled into the office to discuss some concerns she is having with her health.  She was not very clear on what her complaints are.  She says her brother was diagnosed with "cancer" (not sure what kind) but she would like Dr Silverio Decamp to review her last PET scan from 07/01/21.  This was done for pulmonary nodules. She does not mention any GI concerns.  I tried to get her to make an appt but Dr Silverio Decamp does not have any appts and she does not want to see an app.  I told her I would pass this on to Dr Silverio Decamp for review.

## 2022-06-30 NOTE — Telephone Encounter (Signed)
Called patient and discussed her concerns. Esophagus with air fluid levels possible secondary to esophageal dysmotility, GERD.  Patient was offered office visit to further evaluate and possible barium esophagogram , she is willing to come in for office visit with APP, please schedule next available appt

## 2022-07-01 NOTE — Telephone Encounter (Signed)
Appointment scheduled. Patient aware of the appointment.

## 2022-07-14 ENCOUNTER — Telehealth: Payer: Self-pay | Admitting: Emergency Medicine

## 2022-07-15 NOTE — Telephone Encounter (Signed)
Inbound call from patient please give a call back to further advise.  Thank you

## 2022-07-15 NOTE — Telephone Encounter (Signed)
Called and spoke with patient. Patient stated that she didn't want to switch providers, she just needed to make an appointment and wanted to see Dr. Shearon Stalls. Scheduled patient for an appointment with Dr. Shearon Stalls for 11/1.   Nothing further needed.

## 2022-07-16 NOTE — Telephone Encounter (Signed)
Inbound call from patient requesting a call back. Please advise.

## 2022-07-16 NOTE — Telephone Encounter (Signed)
Patient returned your call. Requesting a call back.

## 2022-07-16 NOTE — Telephone Encounter (Signed)
Called the patient back. No answer. Mailbox is full.

## 2022-07-22 NOTE — Telephone Encounter (Signed)
Spoke with the patient. She is inquiring about an appointment sooner than she is scheduled. She voices concerns over previous imaging and her perceived need to repeat the imaging. Encouraged to keep her appointment. She is not having any worsening issues, vomiting, bloody stools or other concerns.

## 2022-07-28 ENCOUNTER — Encounter: Payer: Self-pay | Admitting: Internal Medicine

## 2022-07-28 ENCOUNTER — Ambulatory Visit (INDEPENDENT_AMBULATORY_CARE_PROVIDER_SITE_OTHER): Payer: Medicare Other | Admitting: Internal Medicine

## 2022-07-28 VITALS — BP 140/80 | HR 75 | Temp 98.2°F | Ht 65.0 in | Wt 153.2 lb

## 2022-07-28 DIAGNOSIS — R9389 Abnormal findings on diagnostic imaging of other specified body structures: Secondary | ICD-10-CM

## 2022-07-28 NOTE — Patient Instructions (Addendum)
Follow up with Dr Lamonte Sakai as needed if you develop any symptoms related to your lungs.   I do not recommend any additional scans for your lungs at this time.   Follow up with Dr. Woodward Ku office as scheduled regarding follow up for EGD and colonoscopy.   If you are able to capture any pictures of your rashes, that will be helpful for your primary care doctor to make recommendations on treatment. I do not think redness or heat on your left side would be related to anything going on inside your lungs.  You are welcome to contact medical records to obtain CDs of your CT scans. Your reports are all your record and visible on mychart.

## 2022-07-28 NOTE — Progress Notes (Signed)
[name]. [name]    [id number]    01-29-1951  Primary Care Physician:[name], [name], MD Date of Appointment: 07/28/2022 Established Patient Visit  Chief complaint:   Chief Complaint  Patient presents with   Follow-up    Cough.  Pain with hot feeling in left side     HPI: [name] is a 71 y.o. woman, previously seen by Dr. Lamonte Sakai for an abnormal CT Chest.   Interval Updates: She is here for a "second opinion" regarding multiple concerns.  Including: She has redness on her forehead, heat on her earlobe.  She notes redness and heat on the left side of her chest which is chronic - no rashes. She denies papules. The rash is not present today and she does not have any pictures of it.  She is concerned about the findings of her pet scan performed last year with regards to nodules and gastro-intestinal findings.   She had nasal congestion and chronic sinusitis and underwent sinus surgery with Dr. Redmond Baseman - she is currently on fluticasone nasal spray for this with saline irrigation.  She has some coughing and chronic post nasal drainage. Most recent exam in August 2023 showed no lesions, masses, polyps or exudates with midline septum. Feels the flonase dries her out. Does take nasal saline and saline gel as needed.   I have reviewed the patient's family social and past medical history and updated as appropriate.   Past Medical History:  Diagnosis Date   Anemia    with a pregnancy    GERD (gastroesophageal reflux disease)    pt denies this dx    SVD (spontaneous vaginal delivery)    x 5    Past Surgical History:  Procedure Laterality Date   APPENDECTOMY     CHOLECYSTECTOMY     COLONOSCOPY  08/23/2006   DB- normal    DIRECT LARYNGOSCOPY N/A 01/14/2019   Procedure: DIRECT LARYNGOSCOPY;  Surgeon: Melida Quitter, MD;  Location: [place] [facility];  Service: ENT;  Laterality: N/A;   FOREIGN BODY REMOVAL ESOPHAGEAL  01/14/2019   Procedure: Removal Foreign Body Esophageal;   Surgeon: Melida Quitter, MD;  Location: [facility] OR;  Service: ENT;;   TONSILLECTOMY      Family History  Problem Relation Age of Onset   Aneurysm Father        AAA   Cancer Brother 74       eye melanoma   Cancer - Lung Brother    Colon cancer Neg Hx    Esophageal cancer Neg Hx    Rectal cancer Neg Hx    Stomach cancer Neg Hx    Pancreatic cancer Neg Hx    Lung disease Neg Hx     Social History   Occupational History   Not on file  Tobacco Use   Smoking status: Never   Smokeless tobacco: Never  Vaping Use   Vaping Use: Never used  Substance and Sexual Activity   Alcohol use: No    Alcohol/week: 0.0 standard drinks of alcohol   Drug use: No   Sexual activity: Yes    Birth control/protection: Post-menopausal     Physical Exam: Blood pressure (!) 140/80, pulse 75, temperature 98.2 F (36.8 C), temperature source Oral, height '5\' 5"'$  (1.651 m), weight 153 lb 3.2 oz (69.5 kg), SpO2 99 %.  Gen:      No acute distress ENT:  no nasal polyps, mucus membranes moist, septum appears midline Lungs:  No increased respiratory effort, symmetric chest wall excursion, clear to auscultation bilaterally, no wheezes or crackles CV:         Regular rate and rhythm; no murmurs, rubs, or gallops.  No pedal edema   Data Reviewed: Imaging: I have personally reviewed the PET scan 2022 and CT Chest   PFTs:     Latest Ref Rng & Units 06/25/2021   11:29 AM  PFT Results  FVC-Pre L 2.42   FVC-Predicted Pre % 74   FVC-Post L 2.51   FVC-Predicted Post % 77   Pre FEV1/FVC % % 77   Post FEV1/FCV % % 81   FEV1-Pre L 1.86   FEV1-Predicted Pre % 75   FEV1-Post L 2.03   DLCO uncorrected ml/min/mmHg 20.87   DLCO UNC% % 101   DLCO corrected ml/min/mmHg 20.87   DLCO COR %Predicted % 101   DLVA Predicted % 111   TLC L 4.95   TLC % Predicted % 93   RV % Predicted % 107    I have personally reviewed the patient's PFTs and normal pulmonary function  Labs:  Immunization status: Immunization  History  Administered Date(s) Administered   Fluad Quad(high Dose 65+) 06/26/2019, 06/25/2020   Hep A / Hep B 07/27/2016, 01/10/2017   Hepatitis B, adult 08/31/2016   Influenza Split 06/20/2012   Influenza Whole 08/01/2008   Influenza,inj,Quad PF,6+ Mos 07/15/2015   Influenza-Unspecified 06/16/2016, 06/20/2018   Meningococcal Mcv4o 01/17/2018, 06/26/2019   PFIZER(Purple Top)SARS-COV-2 Vaccination 11/23/2019, 12/18/2019, 07/22/2020   Pneumococcal Conjugate-13 01/17/2018   Pneumococcal Polysaccharide-23 06/25/2020   Td 04/09/2009   Tdap 09/02/2015   Zoster Recombinat (Shingrix) 01/30/2018, 04/02/2018    External Records Personally Reviewed: pulmonary, PCP  Assessment:  LLL pleural based density - resolving.  Stable sub 39m pulmonary nodules - low risk patient Never smoker, no personal history of cancer.    Plan/Recommendations: No further follow up needed for resolving/near resolved LLL pleural based density with linear atelectasis.  No further follow up needed on benign pulmonary nodules based on fleischner criteria.  Follow up with Dr BLamonte Sakaias needed if you develop any symptoms related to your lungs.   I do not recommend any additional scans for your lungs at this time.   Follow up with Dr. NWoodward Kuoffice as scheduled regarding follow up for EGD and colonoscopy.   If you are able to capture any pictures of your rashes, that will be helpful for your primary care doctor to make recommendations on treatment. I do not think redness or heat on your left side would be related to anything going on inside your lungs.  You are welcome to contact medical records to obtain CDs of your CT scans. Your reports are all your record and visible on mychart.    I spent 30 minutes in the care of this patient today including pre-charting, chart review, review of results, face-to-face care, coordination of care and communication with consultants etc.).  Return to Care: Return if symptoms  worsen or fail to improve.   NLenice Llamas MD [facility]

## 2022-08-04 ENCOUNTER — Encounter: Payer: Self-pay | Admitting: Internal Medicine

## 2022-08-04 ENCOUNTER — Ambulatory Visit (INDEPENDENT_AMBULATORY_CARE_PROVIDER_SITE_OTHER): Payer: Medicare Other | Admitting: Internal Medicine

## 2022-08-04 ENCOUNTER — Telehealth: Payer: Self-pay | Admitting: Internal Medicine

## 2022-08-04 VITALS — BP 138/72 | HR 77 | Temp 97.8°F | Ht 65.0 in | Wt 153.0 lb

## 2022-08-04 DIAGNOSIS — L719 Rosacea, unspecified: Secondary | ICD-10-CM

## 2022-08-04 DIAGNOSIS — R9389 Abnormal findings on diagnostic imaging of other specified body structures: Secondary | ICD-10-CM

## 2022-08-04 DIAGNOSIS — D126 Benign neoplasm of colon, unspecified: Secondary | ICD-10-CM | POA: Diagnosis not present

## 2022-08-04 DIAGNOSIS — R0789 Other chest pain: Secondary | ICD-10-CM

## 2022-08-04 DIAGNOSIS — R911 Solitary pulmonary nodule: Secondary | ICD-10-CM | POA: Diagnosis not present

## 2022-08-04 NOTE — Progress Notes (Signed)
Subjective:  Patient ID: Khristi Schiller. Mcgraw, female    DOB: [birth year]  Age: 71 y.o. MRN: [id number]  CC: Follow-up (3 month f/u) and Flank Pain (Pt want to discuss (L) side discomfort, and other things )   HPI Tenee Wish. Bourget presents for L chest burning, it feels bigger - [name] had to go from 36-38 bra to a 40 bra C/o red streaks on face [name] is anxious, wants another chest CT  Outpatient Medications Prior to Visit  Medication Sig Dispense Refill   Cholecalciferol (VITAMIN D3) 2000 units capsule Take 1 capsule (2,000 Units total) by mouth daily. 100 capsule 3   fluticasone (FLONASE) 50 MCG/ACT nasal spray Place 2 sprays into both nostrils 2 (two) times daily.     guaiFENesin (MUCINEX) 600 MG 12 hr tablet Use one bid (Patient not taking: Reported on 07/28/2022) 100 tablet 1   Probiotic Product (ALIGN) 4 MG CAPS Take 1 capsule (4 mg total) by mouth daily. (Patient not taking: Reported on 07/28/2022) 30 capsule 1   No facility-administered medications prior to visit.    ROS: Review of Systems  Constitutional:  Positive for fatigue. Negative for activity change, appetite change, chills and unexpected weight change.  HENT:  Negative for congestion, mouth sores and sinus pressure.   Eyes:  Negative for visual disturbance.  Respiratory:  Positive for chest tightness. Negative for cough.   Gastrointestinal:  Negative for abdominal pain and nausea.  Genitourinary:  Negative for difficulty urinating, frequency and vaginal pain.  Musculoskeletal:  Negative for back pain and gait problem.  Skin:  Negative for pallor and rash.  Neurological:  Negative for dizziness, tremors, weakness, numbness and headaches.  Psychiatric/Behavioral:  Negative for confusion, sleep disturbance and suicidal ideas.     Objective:  BP 138/72 (BP Location: Left Arm)   Pulse 77   Temp 97.8 F (36.6 C) (Oral)   Ht '5\' 5"'$  (1.651 m)   Wt 153 lb (69.4 kg)   LMP  (LMP Unknown)   SpO2 98%   BMI 25.46 kg/m   BP  Readings from Last 3 Encounters:  08/17/22 132/78  08/04/22 138/72  07/28/22 (!) 140/80    Wt Readings from Last 3 Encounters:  08/17/22 154 lb (69.9 kg)  08/04/22 153 lb (69.4 kg)  07/28/22 153 lb 3.2 oz (69.5 kg)    Physical Exam Constitutional:      General: She is not in acute distress.    Appearance: She is well-developed. She is obese.  HENT:     Head: Normocephalic.     Right Ear: External ear normal.     Left Ear: External ear normal.     Nose: Nose normal.  Eyes:     General:        Right eye: No discharge.        Left eye: No discharge.     Conjunctiva/sclera: Conjunctivae normal.     Pupils: Pupils are equal, round, and reactive to light.  Neck:     Thyroid: No thyromegaly.     Vascular: No JVD.     Trachea: No tracheal deviation.  Cardiovascular:     Rate and Rhythm: Normal rate and regular rhythm.     Heart sounds: Normal heart sounds.  Pulmonary:     Effort: No respiratory distress.     Breath sounds: No stridor. No wheezing.  Abdominal:     General: Bowel sounds are normal. There is no distension.     Palpations: Abdomen is soft. There  is no mass.     Tenderness: There is no abdominal tenderness. There is no guarding or rebound.  Musculoskeletal:        General: No tenderness.     Cervical back: Normal range of motion and neck supple. No rigidity.  Lymphadenopathy:     Cervical: No cervical adenopathy.  Skin:    Findings: No erythema or rash.  Neurological:     Mental Status: She is oriented to person, place, and time.     Cranial Nerves: No cranial nerve deficit.     Motor: No abnormal muscle tone.     Coordination: Coordination normal.     Deep Tendon Reflexes: Reflexes normal.  Psychiatric:        Behavior: Behavior normal.        Thought Content: Thought content normal.        Judgment: Judgment normal.   Tender to palp over L lat ribs in mid-chest 6x6 cm area No masses     A total time of 45 minutes was spent preparing to see the  patient, reviewing tests, x-rays, operative reports and other medical records.  Also, obtaining history and performing comprehensive physical exam.  Additionally, counseling the patient regarding the above listed issues - chronic sinusitis, CP, h/o polyps.   Finally, documenting clinical information in the health records, coordination of care.     Lab Results  Component Value Date   WBC 8.0 08/17/2022   HGB 13.8 08/17/2022   HCT 41.8 08/17/2022   PLT 297.0 08/17/2022   GLUCOSE 92 08/17/2022   CHOL 165 06/23/2020   TRIG 70.0 06/23/2020   HDL 58.90 06/23/2020   LDLDIRECT 139.0 04/07/2009   LDLCALC 92 06/23/2020   ALT 16 08/17/2022   AST 18 08/17/2022   NA 139 08/17/2022   K 4.3 08/17/2022   CL 101 08/17/2022   CREATININE 0.73 08/17/2022   BUN 17 08/17/2022   CO2 30 08/17/2022   TSH 2.18 07/18/2020   INR 1.0 09/24/2021    CT Super D Chest Wo Contrast  Result Date: 12/29/2021 CLINICAL DATA:  Follow-up of left lower lobe pulmonary nodule. EXAM: CT CHEST WITHOUT CONTRAST TECHNIQUE: Multidetector CT imaging of the chest was performed using thin slice collimation for electromagnetic bronchoscopy planning purposes, without intravenous contrast. RADIATION DOSE REDUCTION: This exam was performed according to the departmental dose-optimization program which includes automated exposure control, adjustment of the mA and/or kV according to patient size and/or use of iterative reconstruction technique. COMPARISON:  06/11/2021 CT and 07/01/2021 PET FINDINGS: Cardiovascular: Aortic atherosclerosis. Normal heart size, without pericardial effusion. Mediastinum/Nodes: No mediastinal or definite hilar adenopathy, given limitations of unenhanced CT. Esophageal fluid level on 53/2. Lungs/Pleura: No pleural fluid. Right base scarring. Scattered tiny pulmonary nodules are unchanged and marked on series 8. The previously described pleural-based left lower lobe irregular density is decreased. Example 1.5 x 1.0 cm  on 80/8 versus 2.8 x 1.0 cm on the prior diagnostic CT. Upper Abdomen: Normal imaged portions of the liver, spleen, stomach, pancreas, adrenal glands, kidneys. Cholecystectomy. Musculoskeletal: Accentuation of expected thoracic kyphosis. IMPRESSION: The left lower lobe pleural-based density is decreased, most consistent with resolving infection/inflammation. Residual relatively linear density likely represents impacted mucus. Tiny bilateral pulmonary nodules are similar to on the prior exam, favored to be benign. Esophageal air fluid level suggests dysmotility or gastroesophageal reflux. Aortic Atherosclerosis (ICD10-I70.0). Electronically Signed   By: Abigail Miyamoto M.D.   On: 12/29/2021 12:22    Assessment & Plan:   Problem List  Items Addressed This Visit     Abnormal CT of the chest - Primary    Air-fluid level on CT - ?HH vs other Appt w/GI is pending      Relevant Orders   Ambulatory referral to Dermatology   Chest pain, atypical    L chest burning, it feels bigger - [name] had to go from 36-38 bra to a 40 bra Appt w/GI is pending S/p Pulm eval, CT scan PET CT:  Air fluid level in the esophagus, nonspecific but dysmotility is not excluded.  I'm not sure what to do. Will cont to watch. Keep GI appt [name] is anxious, wants another chest CT      Relevant Orders   Ambulatory referral to Dermatology   Colon polyps    Sch colon w/Dr Silverio Decamp in 2024 Last colon in 07/2020. Due in 3 years      Rosacea    Probable vs other Derm ref      Other Visit Diagnoses     Solitary pulmonary nodule       Relevant Orders   Ambulatory referral to Dermatology   CT Chest Wo Contrast (Completed)         No orders of the defined types were placed in this encounter.     Follow-up: Return in about 3 months (around 11/04/2022) for a follow-up visit.  Walker Kehr, MD

## 2022-08-04 NOTE — Assessment & Plan Note (Signed)
Probable vs other Derm ref

## 2022-08-04 NOTE — Assessment & Plan Note (Signed)
Air-fluid level on CT - ?HH vs other Appt w/GI is pending

## 2022-08-04 NOTE — Assessment & Plan Note (Signed)
Sch colon w/Dr Silverio Decamp in 2024 Last colon in 07/2020. Due in 3 years

## 2022-08-04 NOTE — Telephone Encounter (Signed)
PT visits today post visit and is inquiring on their recent referrals for CT. PT is requesting that these be done as Stat if possible as she has an appointment with Dr.Lemmon on the 21st and feels the imaging results would be vital to have prior to that appointment.

## 2022-08-04 NOTE — Assessment & Plan Note (Addendum)
L chest burning, it feels bigger - [name] had to go from 36-38 bra to a 40 bra Appt w/GI is pending S/p Pulm eval, CT scan PET CT:  Air fluid level in the esophagus, nonspecific but dysmotility is not excluded.  I'm not sure what to do. Will cont to watch. Keep GI appt [name] is anxious, wants another chest CT

## 2022-08-05 NOTE — Telephone Encounter (Signed)
There is no indication for a stat CT scan status.  Thank you

## 2022-08-09 ENCOUNTER — Ambulatory Visit
Admission: RE | Admit: 2022-08-09 | Discharge: 2022-08-09 | Disposition: A | Discharge status: Home / Self Care | Payer: Medicare Other | Source: Ambulatory Visit | Attending: Internal Medicine | Admitting: Internal Medicine

## 2022-08-09 DIAGNOSIS — R911 Solitary pulmonary nodule: Secondary | ICD-10-CM | POA: Diagnosis not present

## 2022-08-09 NOTE — Telephone Encounter (Signed)
Called pt no answer LMOM w/MD response../lmb 

## 2022-08-12 ENCOUNTER — Telehealth: Payer: Self-pay | Admitting: Gastroenterology

## 2022-08-12 DIAGNOSIS — J320 Chronic maxillary sinusitis: Secondary | ICD-10-CM | POA: Diagnosis not present

## 2022-08-12 DIAGNOSIS — Z9889 Other specified postprocedural states: Secondary | ICD-10-CM | POA: Diagnosis not present

## 2022-08-12 DIAGNOSIS — J338 Other polyp of sinus: Secondary | ICD-10-CM | POA: Diagnosis not present

## 2022-08-12 NOTE — Telephone Encounter (Signed)
Patient came up to the 3rd floor, trying to get seen sooner. Please see above as I advised patient that she had an appointment on Tuesday 08/17/22. Patient stated that Eustaquio Maize was going to schedule and EGD and Colon, but never did. She wanted to speak to [facility], told patient Eustaquio Maize was at lunch. And she would possibly call her after lunch.

## 2022-08-13 NOTE — Telephone Encounter (Signed)
Checked the schedules of the Advanced Practitioners and Dr Silverio Decamp. No earlier appointments. Keep the appointment that she is scheduled for on 08/17/22

## 2022-08-13 NOTE — Telephone Encounter (Signed)
Inbound call from patient, states she received a call. States she does not know the individual who was calling or the reason. She "Thinks someone was calling her for an appointment."Please advise.  Thank you

## 2022-08-17 ENCOUNTER — Other Ambulatory Visit (INDEPENDENT_AMBULATORY_CARE_PROVIDER_SITE_OTHER): Payer: Medicare Other

## 2022-08-17 ENCOUNTER — Encounter: Payer: Self-pay | Admitting: Physician Assistant

## 2022-08-17 ENCOUNTER — Ambulatory Visit (INDEPENDENT_AMBULATORY_CARE_PROVIDER_SITE_OTHER): Payer: Medicare Other | Admitting: Physician Assistant

## 2022-08-17 VITALS — BP 132/78 | HR 70 | Ht 64.0 in | Wt 154.0 lb

## 2022-08-17 DIAGNOSIS — R948 Abnormal results of function studies of other organs and systems: Secondary | ICD-10-CM

## 2022-08-17 DIAGNOSIS — R1012 Left upper quadrant pain: Secondary | ICD-10-CM

## 2022-08-17 DIAGNOSIS — H25013 Cortical age-related cataract, bilateral: Secondary | ICD-10-CM | POA: Diagnosis not present

## 2022-08-17 DIAGNOSIS — H53143 Visual discomfort, bilateral: Secondary | ICD-10-CM | POA: Diagnosis not present

## 2022-08-17 LAB — COMPREHENSIVE METABOLIC PANEL
ALT: 16 U/L (ref 0–35)
AST: 18 U/L (ref 0–37)
Albumin: 4.7 g/dL (ref 3.5–5.2)
Alkaline Phosphatase: 68 U/L (ref 39–117)
BUN: 17 mg/dL (ref 6–23)
CO2: 30 mEq/L (ref 19–32)
Calcium: 9.9 mg/dL (ref 8.4–10.5)
Chloride: 101 mEq/L (ref 96–112)
Creatinine, Ser: 0.73 mg/dL (ref 0.40–1.20)
GFR: 83.53 mL/min (ref >60.00)
Glucose, Bld: 92 mg/dL (ref 70–99)
Potassium: 4.3 mEq/L (ref 3.5–5.1)
Sodium: 139 mEq/L (ref 135–145)
Total Bilirubin: 0.4 mg/dL (ref 0.2–1.2)
Total Protein: 8 g/dL (ref 6.0–8.3)

## 2022-08-17 LAB — CBC WITH DIFFERENTIAL/PLATELET
Basophils Absolute: 0 10*3/uL (ref 0.0–0.1)
Basophils Relative: 0.5 % (ref 0.0–3.0)
Eosinophils Absolute: 0.2 10*3/uL (ref 0.0–0.7)
Eosinophils Relative: 2.5 % (ref 0.0–5.0)
HCT: 41.8 % (ref 36.0–46.0)
Hemoglobin: 13.8 g/dL (ref 12.0–15.0)
Lymphocytes Relative: 32.7 % (ref 12.0–46.0)
Lymphs Abs: 2.6 10*3/uL (ref 0.7–4.0)
MCHC: 33 g/dL (ref 30.0–36.0)
MCV: 90 fl (ref 78.0–100.0)
Monocytes Absolute: 0.5 10*3/uL (ref 0.1–1.0)
Monocytes Relative: 6.6 % (ref 3.0–12.0)
Neutro Abs: 4.6 10*3/uL (ref 1.4–7.7)
Neutrophils Relative %: 57.7 % (ref 43.0–77.0)
Platelets: 297 10*3/uL (ref 150.0–400.0)
RBC: 4.65 Mil/uL (ref 3.87–5.11)
RDW: 12.8 % (ref 11.5–15.5)
WBC: 8 10*3/uL (ref 4.0–10.5)

## 2022-08-17 MED ORDER — NA SULFATE-K SULFATE-MG SULF 17.5-3.13-1.6 GM/177ML PO SOLN: 1.0000 | Freq: Once | ORAL | 0 refills | Status: AC

## 2022-08-17 NOTE — Progress Notes (Signed)
Chief Complaint: Left-sided abdominal pain  HPI:    [name] is a 71 year old female with a past medical history as listed below including reflux, known to Dr. Silverio Decamp, who was referred to me by [name], [name], MD for a complaint of left-sided abdominal pain.      07/30/2020 colonoscopy done for Dr. Hilarie Fredrickson no history of colon polyps and history of an adenoma 10 mm or greater in size as well as sessile serrated polyp 10 mm or greater in size with four 4-12 mm polyps in the transverse colon, diverticulosis in the sigmoid and descending colon and nonbleeding external and internal hemorrhoids.  Pathology showed sessile serrated polyp.  Repeat recommended in 3 years.    07/01/2021 PET scan with stable appearance of the peripheral curve left lower lobe nodule.  Chronic ethmoid and bilateral maxillary sinusitis and air-fluid level in the esophagus which was nonspecific but dysmotility was not excluded.  Also noted mildly accentuated activity in the right colon which was likely physiologic given the lack of CT correlate.    08/11/2022 CT chest without contrast for follow-up pulmonary nodule shows continued interval decrease in size of the index nodule in the anterior left lower lobe, imaging features compatible resolving infectious/inflammatory etiology as well as a stable 3 mm perifissural right lung nodule.    Today, the patient presents to clinic with a primary complaint of a left-sided pain.  This is more over here lungs/rib cage and she describes it has been there for the past 2 years, initially was intermittent, she did not seem to notify any triggers and now it has become slightly more constant.  Also describes the side feels hot and swollen at times.  She was keeping a food diary which made no difference.  She has not noticed any other GI symptoms.  Does describe workup of the lung nodule as above.  Tells me this all started when she was coughing a lot.  Explains that the nodule decreased in size  after she was on some antibiotics for sinus infection but "the lung doctors will not give me antibiotics".  Now describes a burning over this area.    Also discusses her PET scan back in October 2022 which showed some "activity" in the right colon.  She would like to make sure nothing is going on there.  She has a strong family history of various cancers.  Also discusses the "air-fluid levels" in the esophagus.  This indicated may be esophageal dysmotility, patient does not have any symptoms of this and no reflux or heartburn.  Really no GI symptoms at all with normal daily bowel movements.    Denies documented fever (though sometimes different parts of her face feel hot), chills, nausea, vomiting, heartburn, reflux or change in bowel habits.  Past Medical History:  Diagnosis Date   Anemia    with a pregnancy    GERD (gastroesophageal reflux disease)    pt denies this dx    SVD (spontaneous vaginal delivery)    x 5    Past Surgical History:  Procedure Laterality Date   APPENDECTOMY     CHOLECYSTECTOMY     COLONOSCOPY  08/23/2006   DB- normal    DIRECT LARYNGOSCOPY N/A 01/14/2019   Procedure: DIRECT LARYNGOSCOPY;  Surgeon: Melida Quitter, MD;  Location: [place];  Service: ENT;  Laterality: N/A;   FOREIGN BODY REMOVAL ESOPHAGEAL  01/14/2019   Procedure: Removal Foreign Body Esophageal;  Surgeon: Melida Quitter, MD;  Location: [place];  Service:  ENT;;   TONSILLECTOMY      Current Outpatient Medications  Medication Sig Dispense Refill   Cholecalciferol (VITAMIN D3) 2000 units capsule Take 1 capsule (2,000 Units total) by mouth daily. 100 capsule 3   fluticasone (FLONASE) 50 MCG/ACT nasal spray Place 2 sprays into both nostrils 2 (two) times daily.     No current facility-administered medications for this visit.    Allergies as of 08/17/2022   (No Known Allergies)    Family History  Problem Relation Age of Onset   Aneurysm Father        AAA   Cancer Brother 60       eye melanoma    Cancer - Lung Brother    Colon cancer Neg Hx    Esophageal cancer Neg Hx    Rectal cancer Neg Hx    Stomach cancer Neg Hx    Pancreatic cancer Neg Hx    Lung disease Neg Hx     Social History   Socioeconomic History   Marital status: Married    Spouse name: Not on file   Number of children: 5   Years of education: Not on file   Highest education level: Not on file  Occupational History   Not on file  Tobacco Use   Smoking status: Never   Smokeless tobacco: Never  Vaping Use   Vaping Use: Never used  Substance and Sexual Activity   Alcohol use: No    Alcohol/week: 0.0 standard drinks of alcohol   Drug use: No   Sexual activity: Yes    Birth control/protection: Post-menopausal  Other Topics Concern   Not on file  Social History Narrative   Not on file   Social Determinants of Health   Financial Resource Strain: Not on file  Food Insecurity: Not on file  Transportation Needs: Not on file  Physical Activity: Not on file  Stress: Not on file  Social Connections: Not on file  Intimate Partner Violence: Not on file    Review of Systems:    Constitutional: No fever or chills Cardiovascular: +anterior chest Right lower side pain Respiratory: No SOB  Gastrointestinal: See HPI and otherwise negative   Physical Exam:  Vital signs: BP 132/78   Pulse 70   Ht '5\' 4"'$  (1.626 m)   Wt 154 lb (69.9 kg)   LMP  (LMP Unknown)   SpO2 97%   BMI 26.43 kg/m    Constitutional:   Pleasant Caucasian female appears to be in NAD, Well developed, Well nourished, alert and cooperative Respiratory: Respirations even and unlabored. Lungs clear to auscultation bilaterally.   No wheezes, crackles, or rhonchi.  Cardiovascular: Normal S1, S2. No MRG. Regular rate and rhythm. No peripheral edema, cyanosis or pallor.  Gastrointestinal:  Soft, nondistended, nontender. No rebound or guarding. Normal bowel sounds. No appreciable masses or hepatomegaly. Rectal:  Not performed.  Psychiatric:  Demonstrates good judgement and reason without abnormal affect or behaviors.  MOST RECENT LABS AND SEE HPI FOR IMAGING: CBC    Component Value Date/Time   WBC 7.0 09/24/2021 1548   RBC 4.29 09/24/2021 1548   HGB 12.6 09/24/2021 1548   HCT 38.3 09/24/2021 1548   PLT 342.0 09/24/2021 1548   MCV 89.3 09/24/2021 1548   MCHC 32.9 09/24/2021 1548   RDW 12.6 09/24/2021 1548   LYMPHSABS 2.2 09/24/2021 1548   MONOABS 0.5 09/24/2021 1548   EOSABS 0.2 09/24/2021 1548   BASOSABS 0.1 09/24/2021 1548    CMP  Component Value Date/Time   NA 138 09/24/2021 1548   K 3.9 09/24/2021 1548   CL 100 09/24/2021 1548   CO2 29 09/24/2021 1548   GLUCOSE 106 (H) 09/24/2021 1548   GLUCOSE 91 07/12/2006 1044   BUN 10 09/24/2021 1548   CREATININE 0.83 09/24/2021 1548   CALCIUM 9.6 09/24/2021 1548   PROT 7.7 09/24/2021 1548   ALBUMIN 4.1 09/24/2021 1548   AST 18 09/24/2021 1548   ALT 17 09/24/2021 1548   ALKPHOS 66 09/24/2021 1548   BILITOT 0.4 09/24/2021 1548   GFRNONAA 94.53 04/29/2010 1603   GFRAA 133 09/03/2008 1147    Assessment: 1.  Left sided pain: Feel like this is more likely related to the "inflammatory nodule" in her left anterior lower lobe of her lung, but also seems to radiate downwards at times, bowel movements normal; consider colonic etiology versus more likely lung versus musculoskeletal 2.  Abnormal PET scan of the right colon: Showing increased activity on the right side which was likely physiologic but patient is worried 3.  Abnormal PET scan of the esophagus: Showing air-fluid levels which could indicate dysmotility though patient does not have any symptoms of this  Plan: 1.  Ordered barium esophagram with tablet for further evaluation of "dysmotility" question on PET scan.  Pending this could consider an EGD if needed. 2.  Martin Majestic ahead and scheduled patient for diagnostic colonoscopy in the [place] with Dr. Silverio Decamp in January.  Hopefully this will give [place] enough time to get back  her barium swallow and if needed we can add an EGD.  Did provide the patient a detailed list of risks for the procedure and she agrees to proceed. Patient is appropriate for endoscopic procedure(s) in the ambulatory ([place]) setting.  3.  Ordered a CBC and CMP 4.  Patient to follow in clinic per recommendations after above.  Ellouise Newer, PA-C [facility] 08/17/2022, 1:49 PM  Cc: [name], [name], MD

## 2022-08-17 NOTE — Patient Instructions (Signed)
_______________________________________________________  If you are age 70 or older, your body mass index should be between 23-30. Your Body mass index is 26.43 kg/m. If this is out of the aforementioned range listed, please consider follow up with your Primary Care Provider.  If you are age 49 or younger, your body mass index should be between 19-25. Your Body mass index is 26.43 kg/m. If this is out of the aformentioned range listed, please consider follow up with your Primary Care Provider.   ________________________________________________________  The [facility] providers would like to encourage you to use [facility] to communicate with providers for non-urgent requests or questions.  Due to long hold times on the telephone, sending your provider a message by [facility] may be a faster and more efficient way to get a response.  Please allow 48 business hours for a response.  Please remember that this is for non-urgent requests.  _______________________________________________________  Your provider has requested that you go to the basement level for lab work before leaving today. Press "B" on the elevator. The lab is located at the first door on the left as you exit the elevator.  You have been scheduled for a Barium Esophogram at [facility] (1st floor of the hospital) on 08/20/22 at 9:30 am. Please arrive 30 minutes prior to your appointment for registration. Make certain not to have anything to eat or drink 3 hours prior to your test. If you need to reschedule for any reason, please contact radiology at [phone] to do so. __________________________________________________________________ A barium swallow is an examination that concentrates on views of the esophagus. This tends to be a double contrast exam (barium and two liquids which, when combined, create a gas to distend the wall of the oesophagus) or single contrast (non-ionic iodine based). The study is usually tailored to your  symptoms so a good history is essential. Attention is paid during the study to the form, structure and configuration of the esophagus, looking for functional disorders (such as aspiration, dysphagia, achalasia, motility and reflux) EXAMINATION You may be asked to change into a gown, depending on the type of swallow being performed. A radiologist and radiographer will perform the procedure. The radiologist will advise you of the type of contrast selected for your procedure and direct you during the exam. You will be asked to stand, sit or lie in several different positions and to hold a small amount of fluid in your mouth before being asked to swallow while the imaging is performed .In some instances you may be asked to swallow barium coated marshmallows to assess the motility of a solid food bolus. The exam can be recorded as a digital or video fluoroscopy procedure. POST PROCEDURE It will take 1-2 days for the barium to pass through your system. To facilitate this, it is important, unless otherwise directed, to increase your fluids for the next 24-48hrs and to resume your normal diet.  This test typically takes about 30 minutes to perform. __________________________________________________________________________________

## 2022-08-18 ENCOUNTER — Ambulatory Visit: Payer: Medicare Other | Admitting: Physician Assistant

## 2022-08-18 ENCOUNTER — Telehealth: Payer: Self-pay | Admitting: Physician Assistant

## 2022-08-18 NOTE — Telephone Encounter (Signed)
Inbound call from patient stating that she was having a colonoscopy with Dr. Silverio Decamp on 1/8 and an appointment was made for 2/6 with Dr. Silverio Decamp was scheduled. Patient was wanting to know  what the appointment was for. I advised her that her appointment was a follow up after her procedure. Patient was requesting a sooner appointment and I made her aware multiple times that there was nothing sooner. Patient stated she wanted to see if the nurse could move the appointment up. Please advise.

## 2022-08-20 ENCOUNTER — Ambulatory Visit (HOSPITAL_COMMUNITY)
Admission: RE | Admit: 2022-08-20 | Discharge: 2022-08-20 | Disposition: A | Discharge status: Home / Self Care | Payer: Medicare Other | Source: Ambulatory Visit | Attending: Physician Assistant | Admitting: Physician Assistant

## 2022-08-20 DIAGNOSIS — R1012 Left upper quadrant pain: Secondary | ICD-10-CM | POA: Insufficient documentation

## 2022-08-20 DIAGNOSIS — K224 Dyskinesia of esophagus: Secondary | ICD-10-CM | POA: Diagnosis not present

## 2022-08-20 DIAGNOSIS — R948 Abnormal results of function studies of other organs and systems: Secondary | ICD-10-CM | POA: Insufficient documentation

## 2022-08-23 NOTE — Telephone Encounter (Signed)
Patient called would like to discuss barium results and see if she would be able to have an EGD along with the colonoscopy. States she is still having a burning sensation going on. Asked that you call multiple times if possible cell tower is really weak in her area.

## 2022-08-23 NOTE — Telephone Encounter (Signed)
Pt returned call and was advised by staff that she has the next available appt.

## 2022-08-23 NOTE — Telephone Encounter (Addendum)
Spoke with patient. She was advised the results of the esophagram are normal and have not been reviewed by her providers. Advise she will be contacted about the results after they are reviewed. She describes a burning sensation and a redness. Asked for clarification about this "redness" and she tells me the skin turns red at times. She does not think it is an allergic reaction or hives. She asked me what I thought it was. Advised I did not know of any GI condition that turns the skin red.  [name] also wanted to understand the appointments that are scheduled. This was confusing to her. We discussed that she is scheduled for a colonoscopy in January. This was done at her recent visit with Ellouise Newer, PA. Until the esophagram is reviewed, I do not know if an EGD will be added. The follow up with Dr Silverio Decamp is in February. We discussed that this appointment was made in order to get her seen by Dr Silverio Decamp and this was the first opening.  No further questions by the end of the call.

## 2022-08-23 NOTE — Telephone Encounter (Signed)
Attempted to reach patient on her mobile phone. Her vm is full and not accepting any new messages at this time. Dr. Silverio Decamp does not have any sooner appts.

## 2022-08-27 ENCOUNTER — Telehealth: Payer: Self-pay

## 2022-08-27 NOTE — Telephone Encounter (Signed)
Patient walks in to discuss her concerns. She is wearing a mask, explains it is for her protection because she has had sinus surgery and expects to have more. She is not sick. She comes in with complaints of left upper ribcage "burning and bulging." She reports that this comes and goes. Sometimes she cannot sleep because it is too intense. She has tried Mylanta, hot showers , and cold showers. These do not decrease or change her pain. She is not certain if the pain is triggered or lessened by activity or by lying down. Esophagram was normal. She is scheduled for a colonoscopy. She wants to have a test that will look at this area she has concerns about. She is not having any epigastric symptoms. I offered to her that it is possible that this pain may not be caused by her GI tract. Promised her I would make her provider aware. She may try a OTC lidoderm patch to see if this gives her comfort.

## 2022-08-27 NOTE — Telephone Encounter (Signed)
Agree sounds like a musculo skeletal problem, use lidoderm patch as needed and call PCP if has persistent issue. Thanks

## 2022-08-27 NOTE — Telephone Encounter (Signed)
Inbound call from patient calling to f/u about procedure. Please advise.  Thank you

## 2022-08-30 NOTE — Telephone Encounter (Signed)
Attempted to reach patient. Fast busy signal.

## 2022-09-03 NOTE — Telephone Encounter (Signed)
Attempted to reach patient again. Her phone line just rings, no option to leave a vm. Will await further communication from patient is she has any concerns.

## 2022-09-24 ENCOUNTER — Telehealth: Payer: Self-pay | Admitting: Gastroenterology

## 2022-09-24 NOTE — Telephone Encounter (Signed)
Returned the patient call.  Patient rescheduled for endo/colon on 11/01/2022 3:30

## 2022-09-24 NOTE — Telephone Encounter (Addendum)
Patient came in today, states she would want to do EGD and colon a the same time. States she's willing to pay for it and still having burning. Patient is wanting to know if it's possible to get double procedure at the same time. Requesting a call back. Please call to advise.

## 2022-09-24 NOTE — Telephone Encounter (Signed)
Ok to add EGD to already scheduled colonoscopy for evaluation of persistent GERD symptoms and heartburn.  My schedule is full on 1/8, please check if its ok with [facility] from staffing standpoint if not will need to reschedule procedure to a different day. Thanks

## 2022-09-28 ENCOUNTER — Telehealth: Payer: Self-pay | Admitting: Gastroenterology

## 2022-09-28 ENCOUNTER — Other Ambulatory Visit: Payer: Self-pay

## 2022-09-28 MED ORDER — NA SULFATE-K SULFATE-MG SULF 17.5-3.13-1.6 GM/177ML PO SOLN: ORAL | 0 refills | Status: DC

## 2022-09-28 NOTE — Telephone Encounter (Signed)
Instructions for the new date and time mailed to the patient.

## 2022-09-28 NOTE — Telephone Encounter (Signed)
Spoke with patient. Discussed prep, prep solution, procedure, procedure date, insurance and insurance authorization and moved her appointment for office visit to 11/26/22.

## 2022-09-28 NOTE — Telephone Encounter (Signed)
Patient came in today requesting a copy of her prep, which I printed for her. However patient states she would like to speak the nurse regarding her appointment on 2/6. Also patient would like to know if she needs to get another prep medication.  Requesting a call back as soon as possible on both her cell and phone. Please call to advice.

## 2022-10-04 ENCOUNTER — Encounter: Payer: Medicare Other | Admitting: Gastroenterology

## 2022-10-18 ENCOUNTER — Telehealth: Payer: Self-pay | Admitting: Physician Assistant

## 2022-10-18 NOTE — Telephone Encounter (Signed)
Patient is calling worried that her prep instructions are wrong due to her having to reschedule and add on the EGD. Please advise

## 2022-10-18 NOTE — Telephone Encounter (Signed)
Returned call to patient. I confirmed that the instructions that she has are correct. I informed pt that even though she is having ECL she only has to follow the instructions for the colonoscopy. I told pt that if she follows the written instructions that she has she will be fine. Pt also states that she recently uploaded new insurance cards and wanted to make sure that this was updated for the pre certification. I told pt that I will send a message to our team to make sure they have the correct information. Pt verbalized understanding and had no concerns at the end of the call.   Joey, patient uploaded new insurance card. I also wanted to make sure you knew that this is for EGD and colonoscopy. Thanks

## 2022-10-22 ENCOUNTER — Encounter: Payer: Self-pay | Admitting: Gastroenterology

## 2022-10-26 ENCOUNTER — Encounter: Payer: Medicare Other | Admitting: Internal Medicine

## 2022-11-01 ENCOUNTER — Ambulatory Visit (AMBULATORY_SURGERY_CENTER): Payer: Medicare Other | Admitting: Gastroenterology

## 2022-11-01 ENCOUNTER — Encounter: Payer: Self-pay | Admitting: Gastroenterology

## 2022-11-01 VITALS — BP 139/81 | HR 65 | Temp 98.9°F | Resp 14 | Ht 64.0 in | Wt 154.0 lb

## 2022-11-01 DIAGNOSIS — R1012 Left upper quadrant pain: Secondary | ICD-10-CM

## 2022-11-01 DIAGNOSIS — K2101 Gastro-esophageal reflux disease with esophagitis, with bleeding: Secondary | ICD-10-CM

## 2022-11-01 DIAGNOSIS — R933 Abnormal findings on diagnostic imaging of other parts of digestive tract: Secondary | ICD-10-CM | POA: Diagnosis not present

## 2022-11-01 DIAGNOSIS — K635 Polyp of colon: Secondary | ICD-10-CM | POA: Diagnosis not present

## 2022-11-01 DIAGNOSIS — R948 Abnormal results of function studies of other organs and systems: Secondary | ICD-10-CM

## 2022-11-01 DIAGNOSIS — D122 Benign neoplasm of ascending colon: Secondary | ICD-10-CM

## 2022-11-01 MED ORDER — SODIUM CHLORIDE 0.9 % IV SOLN: 500.0000 mL | Freq: Once | INTRAVENOUS | Status: DC

## 2022-11-01 NOTE — Patient Instructions (Signed)
Await pathology results.  Handouts on GERD, hemorrhoids, diverticulosis, and polyps provided.  YOU HAD AN ENDOSCOPIC PROCEDURE TODAY AT [facility]:   Refer to the procedure report that was given to you for any specific questions about what was found during the examination.  If the procedure report does not answer your questions, please call your gastroenterologist to clarify.  If you requested that your care partner not be given the details of your procedure findings, then the procedure report has been included in a sealed envelope for you to review at your convenience later.  YOU SHOULD EXPECT: Some feelings of bloating in the abdomen. Passage of more gas than usual.  Walking can help get rid of the air that was put into your GI tract during the procedure and reduce the bloating. If you had a lower endoscopy (such as a colonoscopy or flexible sigmoidoscopy) you may notice spotting of blood in your stool or on the toilet paper. If you underwent a bowel prep for your procedure, you may not have a normal bowel movement for a few days.  Please Note:  You might notice some irritation and congestion in your nose or some drainage.  This is from the oxygen used during your procedure.  There is no need for concern and it should clear up in a day or so.  SYMPTOMS TO REPORT IMMEDIATELY:  Following lower endoscopy (colonoscopy or flexible sigmoidoscopy):  Excessive amounts of blood in the stool  Significant tenderness or worsening of abdominal pains  Swelling of the abdomen that is new, acute  Fever of 100F or higher  Following upper endoscopy (EGD)  Vomiting of blood or coffee ground material  New chest pain or pain under the shoulder blades  Painful or persistently difficult swallowing  New shortness of breath  Fever of 100F or higher  Black, tarry-looking stools  For urgent or emergent issues, a gastroenterologist can be reached at any hour by calling [phone]. Do not use  MyChart messaging for urgent concerns.    DIET:  We do recommend a small meal at first, but then you may proceed to your regular diet.  Drink plenty of fluids but you should avoid alcoholic beverages for 24 hours.  ACTIVITY:  You should plan to take it easy for the rest of today and you should NOT DRIVE or use heavy machinery until tomorrow (because of the sedation medicines used during the test).    FOLLOW UP: Our staff will call the number listed on your records the next business day following your procedure.  We will call around 7:15- 8:00 am to check on you and address any questions or concerns that you may have regarding the information given to you following your procedure. If we do not reach you, we will leave a message.     If any biopsies were taken you will be contacted by phone or by letter within the next 1-3 weeks.  Please call us at [phone] if you have not heard about the biopsies in 3 weeks.    SIGNATURES/CONFIDENTIALITY: You and/or your care partner have signed paperwork which will be entered into your electronic medical record.  These signatures attest to the fact that that the information above on your After Visit Summary has been reviewed and is understood.  Full responsibility of the confidentiality of this discharge information lies with you and/or your care-partner.

## 2022-11-01 NOTE — Progress Notes (Unsigned)
Sedate, gd SR, tolerated procedure well, VSS, report to RN 

## 2022-11-01 NOTE — Progress Notes (Unsigned)
Called to room to assist during endoscopic procedure.  Patient ID and intended procedure confirmed with present staff. Received instructions for my participation in the procedure from the performing physician.  

## 2022-11-01 NOTE — Progress Notes (Unsigned)
VS completed by DT.  Pt's states no medical or surgical changes since previsit or office visit.  

## 2022-11-01 NOTE — Progress Notes (Unsigned)
[facility] History and Physical   Primary Care Physician:  Cassandria Anger, MD   Reason for Procedure:  Abnormal PET CT  Plan:    EGD and colonoscopy with possible interventions as needed     HPI: [name]. Kolker is a very pleasant 72 y.o. female here for EGD for persistent GERD symptoms and colonoscopy for evaluation of abnormal PET CT in Right colon and esophagus C/o left side abdominal pain  The risks and benefits as well as alternatives of endoscopic procedure(s) have been discussed and reviewed. All questions answered. The patient agrees to proceed.    Past Medical History:  Diagnosis Date   Anemia    pt denies this dx   GERD (gastroesophageal reflux disease)    pt denies this dx    SVD (spontaneous vaginal delivery)    x 5    Past Surgical History:  Procedure Laterality Date   APPENDECTOMY     CHOLECYSTECTOMY     COLONOSCOPY  08/23/2006   DB- normal    DIRECT LARYNGOSCOPY N/A 01/14/2019   Procedure: DIRECT LARYNGOSCOPY;  Surgeon: Melida Quitter, MD;  Location: [facility];  Service: ENT;  Laterality: N/A;   FOREIGN BODY REMOVAL ESOPHAGEAL  01/14/2019   Procedure: Removal Foreign Body Esophageal;  Surgeon: Melida Quitter, MD;  Location: [place];  Service: ENT;;   FUNCTIONAL ENDOSCOPIC SINUS SURGERY     TONSILLECTOMY      Prior to Admission medications   Medication Sig Start Date End Date Taking? Authorizing Provider  Cholecalciferol (VITAMIN D3) 2000 units capsule Take 1 capsule (2,000 Units total) by mouth daily. 01/10/17   [name], [name], MD  fluticasone (FLONASE) 50 MCG/ACT nasal spray Place 2 sprays into both nostrils 2 (two) times daily. 05/25/22   [provider]    Current Outpatient Medications  Medication Sig Dispense Refill   Cholecalciferol (VITAMIN D3) 2000 units capsule Take 1 capsule (2,000 Units total) by mouth daily. 100 capsule 3   fluticasone (FLONASE) 50 MCG/ACT nasal spray Place 2 sprays into both nostrils 2 (two)  times daily.     Current Facility-Administered Medications  Medication Dose Route Frequency Provider Last Rate Last Admin   0.9 %  sodium chloride infusion  500 mL Intravenous Once Mauri Pole, MD        Allergies as of 11/01/2022   (No Known Allergies)    Family History  Problem Relation Age of Onset   Aneurysm Father        AAA   Cancer Brother 39       eye melanoma   Cancer - Lung Brother    Colon cancer Neg Hx    Esophageal cancer Neg Hx    Rectal cancer Neg Hx    Stomach cancer Neg Hx    Pancreatic cancer Neg Hx    Lung disease Neg Hx     Social History   Socioeconomic History   Marital status: Married    Spouse name: Not on file   Number of children: 5   Years of education: Not on file   Highest education level: Not on file  Occupational History   Not on file  Tobacco Use   Smoking status: Never   Smokeless tobacco: Never  Vaping Use   Vaping Use: Never used  Substance and Sexual Activity   Alcohol use: No    Alcohol/week: 0.0 standard drinks of alcohol   Drug use: No   Sexual activity: Yes    Birth control/protection: Post-menopausal  Other Topics Concern   Not on file  Social History Narrative   Not on file   Social Determinants of Health   Financial Resource Strain: Not on file  Food Insecurity: Not on file  Transportation Needs: Not on file  Physical Activity: Not on file  Stress: Not on file  Social Connections: Not on file  Intimate Partner Violence: Not on file    Review of Systems:  All other review of systems negative except as mentioned in the HPI.  Physical Exam: Vital signs in last 24 hours: Blood Pressure (Abnormal) 147/73   Pulse 75   Temperature 98.9 F (37.2 C) (Temporal)   Height '5\' 4"'$  (1.626 m)   Weight 154 lb (69.9 kg)   Last Menstrual Period  (LMP Unknown)   Oxygen Saturation 99%   Body Mass Index 26.43 kg/m  General:   Alert, NAD Lungs:  Clear .   Heart:  Regular rate and rhythm Abdomen:  Soft,  nontender and nondistended. Neuro/Psych:  Alert and cooperative. Normal mood and affect. A and O x 3  Reviewed labs, radiology imaging, old records and pertinent past GI work up  Patient is appropriate for planned procedure(s) and anesthesia in an ambulatory setting   K. Denzil Magnuson , MD [phone]

## 2022-11-01 NOTE — Op Note (Signed)
[facility] Patient Name: [name] Procedure Date: 11/01/2022 3:16 PM MRN: [id number] Endoscopist: Mauri Pole , MD, [id number] Age: 72 Referring MD:  Date of Birth: Jan 30, 1951 Gender: Female Account #: [id number] Procedure:                Colonoscopy Indications:              Abnormal PET scan of the GI tract Medicines:                Monitored Anesthesia Care Procedure:                Pre-Anesthesia Assessment:                           - Prior to the procedure, a History and Physical                            was performed, and patient medications and                            allergies were reviewed. The patient's tolerance of                            previous anesthesia was also reviewed. The risks                            and benefits of the procedure and the sedation                            options and risks were discussed with the patient.                            All questions were answered, and informed consent                            was obtained. Prior Anticoagulants: The patient has                            taken no anticoagulant or antiplatelet agents. ASA                            Grade Assessment: III - A patient with severe                            systemic disease. After reviewing the risks and                            benefits, the patient was deemed in satisfactory                            condition to undergo the procedure.                           After obtaining informed consent, the colonoscope  was passed under direct vision. Throughout the                            procedure, the patient's blood pressure, pulse, and                            oxygen saturations were monitored continuously. The                            Olympus [id number]-[id number]) Colonoscope was                            introduced through the anus and advanced to the the                            cecum,  identified by appendiceal orifice and                            ileocecal valve. The colonoscopy was performed                            without difficulty. The patient tolerated the                            procedure well. The quality of the bowel                            preparation was good. The ileocecal valve,                            appendiceal orifice, and rectum were photographed. Scope In: 3:39:00 PM Scope Out: 3:49:47 PM Scope Withdrawal Time: 0 hours 8 minutes 43 seconds  Total Procedure Duration: 0 hours 10 minutes 47 seconds  Findings:                 The perianal and digital rectal examinations were                            normal.                           A 10 mm polyp was found in the ascending colon. The                            polyp was sessile. The polyp was removed with a                            cold snare. Resection and retrieval were complete.                           A few small-mouthed diverticula were found in the                            sigmoid colon and descending colon.  Non-bleeding external and internal hemorrhoids were                            found during retroflexion. The hemorrhoids were                            medium-sized. Complications:            No immediate complications. Estimated Blood Loss:     Estimated blood loss was minimal. Impression:               - One 10 mm polyp in the ascending colon, removed                            with a cold snare. Resected and retrieved.                           - Diverticulosis in the sigmoid colon and in the                            descending colon.                           - Non-bleeding external and internal hemorrhoids. Recommendation:           - Patient has a contact number available for                            emergencies. The signs and symptoms of potential                            delayed complications were discussed with the                             patient. Return to normal activities tomorrow.                            Written discharge instructions were provided to the                            patient.                           - Resume previous diet.                           - Continue present medications.                           - Await pathology results.                           - Repeat colonoscopy in 3 - 5 years for                            surveillance based on pathology results. Mauri Pole, MD 11/01/2022 3:54:04 PM This report has been signed  electronically.

## 2022-11-01 NOTE — Op Note (Signed)
[place] Patient Name: [name] Procedure Date: 11/01/2022 3:22 PM MRN: [id number] Endoscopist: Mauri Pole , MD, [id number] Age: 72 Referring MD:  Date of Birth: 1951/09/17 Gender: Female Account #: [id number] Procedure:                Upper GI endoscopy Indications:              Abnormal PET scan of the GI tract Medicines:                Monitored Anesthesia Care Procedure:                Pre-Anesthesia Assessment:                           - Prior to the procedure, a History and Physical                            was performed, and patient medications and                            allergies were reviewed. The patient's tolerance of                            previous anesthesia was also reviewed. The risks                            and benefits of the procedure and the sedation                            options and risks were discussed with the patient.                            All questions were answered, and informed consent                            was obtained. Prior Anticoagulants: The patient has                            taken no anticoagulant or antiplatelet agents. ASA                            Grade Assessment: III - A patient with severe                            systemic disease. After reviewing the risks and                            benefits, the patient was deemed in satisfactory                            condition to undergo the procedure.                           After obtaining informed consent, the endoscope was  passed under direct vision. Throughout the                            procedure, the patient's blood pressure, pulse, and                            oxygen saturations were monitored continuously. The                            GIF [id number] #[id number] was introduced through the                            mouth, and advanced to the second part of duodenum.                            The upper  GI endoscopy was accomplished without                            difficulty. The patient tolerated the procedure                            well. Scope In: Scope Out: Findings:                 LA Grade A (one or more mucosal breaks less than 5                            mm, not extending between tops of 2 mucosal folds)                            esophagitis with no bleeding was found 36 to 38 cm                            from the incisors.                           A 2 cm hiatal hernia was present.                           The stomach was normal.                           The cardia and gastric fundus were normal on                            retroflexion.                           The examined duodenum was normal. Complications:            No immediate complications. Estimated Blood Loss:     Estimated blood loss was minimal. Impression:               - LA Grade A reflux esophagitis with no bleeding.                           -  2 cm hiatal hernia.                           - Normal stomach.                           - Normal examined duodenum.                           - No specimens collected. Recommendation:           - Patient has a contact number available for                            emergencies. The signs and symptoms of potential                            delayed complications were discussed with the                            patient. Return to normal activities tomorrow.                            Written discharge instructions were provided to the                            patient.                           - Resume previous diet.                           - Continue present medications.                           - Follow an antireflux regimen.                           - See the other procedure note for documentation of                            additional recommendations. Mauri Pole, MD 11/01/2022 3:56:37 PM This report has been signed electronically.

## 2022-11-02 ENCOUNTER — Telehealth: Payer: Self-pay

## 2022-11-02 ENCOUNTER — Ambulatory Visit: Payer: Medicare Other | Admitting: Gastroenterology

## 2022-11-02 ENCOUNTER — Encounter: Payer: Self-pay | Admitting: Gastroenterology

## 2022-11-02 ENCOUNTER — Ambulatory Visit: Payer: Medicare Other | Admitting: Internal Medicine

## 2022-11-02 NOTE — Telephone Encounter (Signed)
  Follow up Call-     11/01/2022    2:46 PM 07/30/2020    2:23 PM  Call back number  Post procedure Call Back phone  # [phone]  Permission to leave phone message Yes Yes     Patient questions:  Do you have a fever, pain , or abdominal swelling? No. Pain Score  0 *  Have you tolerated food without any problems? Yes.    Have you been able to return to your normal activities? Yes.    Do you have any questions about your discharge instructions: Diet   No. Medications  No. Follow up visit  No.  Do you have questions or concerns about your Care? No.  Actions: * If pain score is 4 or above: No action needed, pain <4.

## 2022-11-03 ENCOUNTER — Telehealth: Payer: Self-pay | Admitting: Gastroenterology

## 2022-11-03 NOTE — Telephone Encounter (Signed)
Inbound call from patient requesting a call back to discuss the 2 prescriptions she has for [facility]. Please advise.

## 2022-11-04 NOTE — Telephone Encounter (Signed)
Fluticasone was not prescribed by GI.  Called the patient back. No answer. No voicemail.

## 2022-11-11 ENCOUNTER — Encounter: Payer: Self-pay | Admitting: Gastroenterology

## 2022-11-26 ENCOUNTER — Ambulatory Visit (INDEPENDENT_AMBULATORY_CARE_PROVIDER_SITE_OTHER): Payer: Medicare Other | Admitting: Gastroenterology

## 2022-11-26 ENCOUNTER — Encounter: Payer: Self-pay | Admitting: Gastroenterology

## 2022-11-26 VITALS — BP 136/78 | HR 78 | Ht 65.0 in | Wt 158.8 lb

## 2022-11-26 DIAGNOSIS — K2100 Gastro-esophageal reflux disease with esophagitis, without bleeding: Secondary | ICD-10-CM

## 2022-11-26 DIAGNOSIS — Z86010 Personal history of colonic polyps: Secondary | ICD-10-CM | POA: Diagnosis not present

## 2022-11-26 DIAGNOSIS — K449 Diaphragmatic hernia without obstruction or gangrene: Secondary | ICD-10-CM

## 2022-11-26 DIAGNOSIS — R1012 Left upper quadrant pain: Secondary | ICD-10-CM | POA: Diagnosis not present

## 2022-11-26 MED ORDER — FAMOTIDINE 20 MG PO TABS: 20.0000 mg | ORAL_TABLET | Freq: Two times a day (BID) | ORAL | 2 refills | Status: AC

## 2022-11-26 NOTE — Progress Notes (Signed)
[name]. [place]    [id number]    Feb 10, 1951  Primary Care Physician:[name], [name], MD  Referring Physician: Cassandria Anger, MD [address] [place],  [place] [address]   Chief complaint:   Chief Complaint  Patient presents with   LUQ pain   Abnormal pet scan     HPI:  Patient has been previously seen by PA Ellouise Newer for a complaint of LUQ pain and a history of GERD. She has had 2 EGD with me last one being on 11/01/2022.  [name]. [name] is a 72 y.o. female presenting to clinic today for a follow up regarding LUQ pain and abnormal PET scans.   Today, she complains of LUQ pain and this area occasionally feels warm and looks red. She denies having an BM issues. She denies diarrhea, constipation, nausea, blood in stool, black stool, heartburn, vomiting.   GI Hx:  EGD 11/01/2022 - LA Grade A reflux esophagitis with no bleeding.  - 2 cm hiatal hernia. - Normal stomach.  - Normal examined duodenum.  - No specimens collected.  Colonoscopy 11/01/2022 - One 10 mm polyp in the ascending colon, removed with a cold snare. Resected and retrieved.  - Diverticulosis in the sigmoid colon and in the descending colon.  - Non-bleeding external and internal hemorrhoids.  Pathology  11/01/2022 Surgical [P], colon, ascending, polyp (1) - SESSILE SERRATED POLYP WITHOUT DYSPLASIA  DG Esophagus w Double CM 08/20/2022 Swallowing: Appears normal. No vestibular penetration or aspiration seen. Pharynx: Unremarkable. Esophagus: Normal appearance. Esophageal motility: Within normal limits. Hiatal Hernia: None. Gastroesophageal reflux: Small amount of residual barium seen in the distal esophagus but no spontaneous reflux seen. No reflux seen with cough or Valsalva. Ingested 42m barium tablet: Passed normally   CT Chest WO Contrast 08/09/2022 1. Continued interval decrease in size of the index nodule in the anterior left lower lobe. Imaging features  compatible with resolving infectious/inflammatory etiology. 2. Stable 3 mm perifissural right lung nodule, most likely benign. No followup imaging is recommended.  PET Skull Base to Thigh 0/01/2021 1. Stable appearance of the peripheral curved left lower lobe nodule with maximum SUV of 2.1. Possibilities include benign bronchocele with local inflammation, versus low-grade malignancy. If tissue diagnosis is not elected, I would recommend CT imaging of the chest in 3-6 months time for initial re-evaluation. 2. Chronic ethmoid and bilateral maxillary sinusitis. 3. Air fluid level in the esophagus, nonspecific but dysmotility is not excluded.  Colonoscopy 07/30/2020 - LA Grade A reflux esophagitis with no bleeding.  - 2 cm hiatal hernia.  - Normal stomach.  - Normal examined duodenum.  - No specimens collected.  Pathology 07/30/2020 Surgical [P], colon, transverse, polyp (4) - SESSILE SERRATED POLYP (4 OF 6 FRAGMENTS) - BENIGN COLONIC MUCOSA (2 OF 6 FRAGMENTS) - NO HIGH GRADE DYSPLASIA OR MALIGNANCY IDENTIFIED  Colonoscopy  03/09/2017 - One 15 mm polyp in the transverse colon, removed piecemeal using a cold snare. Resected and retrieved. - Non-bleeding internal hemorrhoids.  Pathology 03/09/2017 Surgical [P], transverse, polyp - SESSILE SERRATED POLYP. - NO DYSPLASIA OR MALIGNANCY.  Colonoscopy 08/23/2006  Normal    Current Outpatient Medications:    Cholecalciferol (VITAMIN D3) 2000 units capsule, Take 1 capsule (2,000 Units total) by mouth daily., Disp: 100 capsule, Rfl: 3   fluticasone ([employer] FLUTICASONE PROPIONATE) 50 MCG/ACT nasal spray, Place 2 sprays into both nostrils in the morning and at bedtime., Disp: , Rfl:    Allergies  as of 11/26/2022   (No Known Allergies)    Past Medical History:  Diagnosis Date   Anemia    pt denies this dx   GERD (gastroesophageal reflux disease)    pt denies this dx    SVD (spontaneous vaginal delivery)    x 5    Past Surgical  History:  Procedure Laterality Date   APPENDECTOMY     CHOLECYSTECTOMY     COLONOSCOPY  08/23/2006   DB- normal    DIRECT LARYNGOSCOPY N/A 01/14/2019   Procedure: DIRECT LARYNGOSCOPY;  Surgeon: Melida Quitter, MD;  Location[facility] OR;  Service: ENT;  Laterality: N/A;   FOREIGN BODY REMOVAL ESOPHAGEAL  01/14/2019   Procedure: Removal Foreign Body Esophageal;  Surgeon: Melida Quitter, MD;  Location: [facility] OR;  Service: ENT;;   FUNCTIONAL ENDOSCOPIC SINUS SURGERY     TONSILLECTOMY      Family History  Problem Relation Age of Onset   Aneurysm Father        AAA   Cancer Brother 36       eye melanoma   Cancer - Lung Brother    Colon cancer Neg Hx    Esophageal cancer Neg Hx    Rectal cancer Neg Hx    Stomach cancer Neg Hx    Pancreatic cancer Neg Hx    Lung disease Neg Hx     Social History   Socioeconomic History   Marital status: Married    Spouse name: Not on file   Number of children: 5   Years of education: Not on file   Highest education level: Not on file  Occupational History   Not on file  Tobacco Use   Smoking status: Never   Smokeless tobacco: Never  Vaping Use   Vaping Use: Never used  Substance and Sexual Activity   Alcohol use: No    Alcohol/week: 0.0 standard drinks of alcohol   Drug use: No   Sexual activity: Yes    Birth control/protection: Post-menopausal  Other Topics Concern   Not on file  Social History Narrative   Not on file   Social Determinants of Health   Financial Resource Strain: Not on file  Food Insecurity: Not on file  Transportation Needs: Not on file  Physical Activity: Not on file  Stress: Not on file  Social Connections: Not on file  Intimate Partner Violence: Not on file      Review of systems: Review of Systems  Gastrointestinal:  Positive for abdominal pain. Negative for blood in stool, constipation, diarrhea, nausea and vomiting.       -heartburn      Physical Exam: Vitals:   11/26/22 1334  BP: 136/78  Pulse: 78   SpO2: 96%   Body mass index is 26.43 kg/m.  General: well-appearing, no acute distress Eyes: sclera anicteric, no redness Neuro: awake, alert and oriented x 3. Normal gross motor function and fluent speech   Data Reviewed:  Reviewed labs, radiology imaging, old records and pertinent past GI work up   Assessment and Plan/Recommendations: 72 year old pleasant female here for follow-up visit status post EGD and colonoscopy for abnormal PET/CT Overall her symptoms are stable, continues to have intermittent right upper quadrant discomfort.  She has had extensive imaging including chest CT and PET CT scan, unrevealing etiology She has a small hiatal hernia and erosive esophagitis on EGD, likely secondary to silent acid reflux  GERD: Continue antireflux measures Start Pepcid 20 mg twice daily as needed Avoid NSAIDs  History  of adenomatous colon polyps: Due for surveillance colonoscopy February 2027  Return as needed  This visit required 30 minutes of patient care (this includes precharting, chart review, review of results, face-to-face time used for counseling as well as treatment plan and follow-up. The patient was provided an opportunity to ask questions and all were answered. The patient agreed with the plan and demonstrated an understanding of the instructions.  Damaris Hippo , MD  CC: [name], [name], MD   Joretta Bachelor Johnston Ebbs as a scribe for Harl Bowie, MD.,have documented all relevant documentation on the behalf of Harl Bowie, MD,as directed by  Harl Bowie, MD while in the presence of Harl Bowie, MD.   I, Harl Bowie, MD, have reviewed all documentation for this visit. The documentation on 11/26/22 for the exam, diagnosis, procedures, and orders are all accurate and complete.

## 2022-11-26 NOTE — Patient Instructions (Addendum)
Gastroesophageal Reflux Disease, Adult Gastroesophageal reflux (GER) happens when acid from the stomach flows up into the tube that connects the mouth and the stomach (esophagus). Normally, food travels down the esophagus and stays in the stomach to be digested. However, when a person has GER, food and stomach acid sometimes move back up into the esophagus. If this becomes a more serious problem, the person may be diagnosed with a disease called gastroesophageal reflux disease (GERD). GERD occurs when the reflux: Happens often. Causes frequent or severe symptoms. Causes problems such as damage to the esophagus. When stomach acid comes in contact with the esophagus, the acid may cause inflammation in the esophagus. Over time, GERD may create small holes (ulcers) in the lining of the esophagus. What are the causes? This condition is caused by a problem with the muscle between the esophagus and the stomach (lower esophageal sphincter, or LES). Normally, the LES muscle closes after food passes through the esophagus to the stomach. When the LES is weakened or abnormal, it does not close properly, and that allows food and stomach acid to go back up into the esophagus. The LES can be weakened by certain dietary substances, medicines, and medical conditions, including: Tobacco use. Pregnancy. Having a hiatal hernia. Alcohol use. Certain foods and beverages, such as coffee, chocolate, onions, and peppermint. What increases the risk? You are more likely to develop this condition if you: Have an increased body weight. Have a connective tissue disorder. Take NSAIDs, such as ibuprofen. What are the signs or symptoms? Symptoms of this condition include: Heartburn. Difficult or painful swallowing and the feeling of having a lump in the throat. A bitter taste in the mouth. Bad breath and having a large amount of saliva. Having an upset or bloated stomach and belching. Chest pain. Different conditions can  cause chest pain. Make sure you see your health care provider if you experience chest pain. Shortness of breath or wheezing. Ongoing (chronic) cough or a nighttime cough. Wearing away of tooth enamel. Weight loss. How is this diagnosed? This condition may be diagnosed based on a medical history and a physical exam. To determine if you have mild or severe GERD, your health care provider may also monitor how you respond to treatment. You may also have tests, including: A test to examine your stomach and esophagus with a small camera (endoscopy). A test that measures the acidity level in your esophagus. A test that measures how much pressure is on your esophagus. A barium swallow or modified barium swallow test to show the shape, size, and functioning of your esophagus. How is this treated? Treatment for this condition may vary depending on how severe your symptoms are. Your health care provider may recommend: Changes to your diet. Medicine. Surgery. The goal of treatment is to help relieve your symptoms and to prevent complications. Follow these instructions at home: Eating and drinking  Follow a diet as recommended by your health care provider. This may involve avoiding foods and drinks such as: Coffee and tea, with or without caffeine. Drinks that contain alcohol. Energy drinks and sports drinks. Carbonated drinks or sodas. Chocolate and cocoa. Peppermint and mint flavorings. Garlic and onions. Horseradish. Spicy and acidic foods, including peppers, chili powder, curry powder, vinegar, hot sauces, and barbecue sauce. Citrus fruit juices and citrus fruits, such as oranges, lemons, and limes. Tomato-based foods, such as red sauce, chili, salsa, and pizza with red sauce. Fried and fatty foods, such as donuts, french fries, potato chips, and high-fat dressings.   High-fat meats, such as hot dogs and fatty cuts of red and white meats, such as rib eye steak, sausage, ham, and  bacon. High-fat dairy items, such as whole milk, butter, and cream cheese. Eat small, frequent meals instead of large meals. Avoid drinking large amounts of liquid with your meals. Avoid eating meals during the 2-3 hours before bedtime. Avoid lying down right after you eat. Do not exercise right after you eat. Lifestyle  Do not use any products that contain nicotine or tobacco. These products include cigarettes, chewing tobacco, and vaping devices, such as e-cigarettes. If you need help quitting, ask your health care provider. Try to reduce your stress by using methods such as yoga or meditation. If you need help reducing stress, ask your health care provider. If you are overweight, reduce your weight to an amount that is healthy for you. Ask your health care provider for guidance about a safe weight loss goal. General instructions Pay attention to any changes in your symptoms. Take over-the-counter and prescription medicines only as told by your health care provider. Do not take aspirin, ibuprofen, or other NSAIDs unless your health care provider told you to take these medicines. Wear loose-fitting clothing. Do not wear anything tight around your waist that causes pressure on your abdomen. Raise (elevate) the head of your bed about 6 inches (15 cm). You can use a wedge to do this. Avoid bending over if this makes your symptoms worse. Keep all follow-up visits. This is important. Contact a health care provider if: You have: New symptoms. Unexplained weight loss. Difficulty swallowing or it hurts to swallow. Wheezing or a persistent cough. A hoarse voice. Your symptoms do not improve with treatment. Get help right away if: You have sudden pain in your arms, neck, jaw, teeth, or back. You suddenly feel sweaty, dizzy, or light-headed. You have chest pain or shortness of breath. You vomit and the vomit is green, yellow, or black, or it looks like blood or coffee grounds. You faint. You  have stool that is red, bloody, or black. You cannot swallow, drink, or eat. These symptoms may represent a serious problem that is an emergency. Do not wait to see if the symptoms will go away. Get medical help right away. Call your local emergency services (911 in the [place] Do not drive yourself to the hospital. Summary Gastroesophageal reflux happens when acid from the stomach flows up into the esophagus. GERD is a disease in which the reflux happens often, causes frequent or severe symptoms, or causes problems such as damage to the esophagus. Treatment for this condition may vary depending on how severe your symptoms are. Your health care provider may recommend diet and lifestyle changes, medicine, or surgery. Contact a health care provider if you have new or worsening symptoms. Take over-the-counter and prescription medicines only as told by your health care provider. Do not take aspirin, ibuprofen, or other NSAIDs unless your health care provider told you to do so. Keep all follow-up visits as told by your health care provider. This is important. This information is not intended to replace advice given to you by your health care provider. Make sure you discuss any questions you have with your health care provider. Document Revised: 03/24/2020 Document Reviewed: 03/24/2020 Elsevier Patient Education  [place].    I appreciate the  opportunity to care for you  Thank You   Harl Bowie , MD

## 2022-12-14 DIAGNOSIS — J320 Chronic maxillary sinusitis: Secondary | ICD-10-CM | POA: Diagnosis not present

## 2022-12-15 NOTE — Addendum Note (Signed)
Addended by: Annabell Howells on: 12/15/2022 02:57 PM   Modules accepted: Orders

## 2023-01-12 DIAGNOSIS — D3131 Benign neoplasm of right choroid: Secondary | ICD-10-CM | POA: Diagnosis not present

## 2023-03-15 ENCOUNTER — Telehealth: Payer: Self-pay

## 2023-03-15 NOTE — Telephone Encounter (Signed)
Patient comes to the office bringing a note for the doctor. The patient is requesting the following; "Please remove GERD and the drug famotidine from my chart so that the system quits showing GERD as my reason for all my appointments; because Doctors are canceling appointments because they do not treat GERD. Thank you [name] [email or web address]

## 2023-03-15 NOTE — Telephone Encounter (Signed)
I am not sure how to alter the chart if it is part of her history and if she was prescribed famotidine at some point.

## 2023-03-21 DIAGNOSIS — D3132 Benign neoplasm of left choroid: Secondary | ICD-10-CM | POA: Diagnosis not present

## 2023-03-21 DIAGNOSIS — H43823 Vitreomacular adhesion, bilateral: Secondary | ICD-10-CM | POA: Diagnosis not present

## 2023-06-23 DIAGNOSIS — Z1231 Encounter for screening mammogram for malignant neoplasm of breast: Secondary | ICD-10-CM | POA: Diagnosis not present

## 2023-06-23 LAB — HM MAMMOGRAPHY

## 2023-08-16 DIAGNOSIS — R300 Dysuria: Secondary | ICD-10-CM | POA: Diagnosis not present

## 2023-08-18 DIAGNOSIS — H524 Presbyopia: Secondary | ICD-10-CM | POA: Diagnosis not present

## 2023-08-18 DIAGNOSIS — H25013 Cortical age-related cataract, bilateral: Secondary | ICD-10-CM | POA: Diagnosis not present

## 2023-08-18 DIAGNOSIS — H52223 Regular astigmatism, bilateral: Secondary | ICD-10-CM | POA: Diagnosis not present

## 2023-08-18 DIAGNOSIS — D3132 Benign neoplasm of left choroid: Secondary | ICD-10-CM | POA: Diagnosis not present

## 2023-08-18 DIAGNOSIS — H53143 Visual discomfort, bilateral: Secondary | ICD-10-CM | POA: Diagnosis not present

## 2023-09-19 DIAGNOSIS — H43823 Vitreomacular adhesion, bilateral: Secondary | ICD-10-CM | POA: Diagnosis not present

## 2023-09-19 DIAGNOSIS — H2513 Age-related nuclear cataract, bilateral: Secondary | ICD-10-CM | POA: Diagnosis not present

## 2023-09-19 DIAGNOSIS — D3132 Benign neoplasm of left choroid: Secondary | ICD-10-CM | POA: Diagnosis not present

## 2023-11-15 DIAGNOSIS — H3509 Other intraretinal microvascular abnormalities: Secondary | ICD-10-CM | POA: Diagnosis not present

## 2023-12-12 ENCOUNTER — Encounter: Payer: Self-pay | Admitting: Emergency Medicine

## 2024-03-13 DIAGNOSIS — H2513 Age-related nuclear cataract, bilateral: Secondary | ICD-10-CM | POA: Diagnosis not present

## 2024-03-13 DIAGNOSIS — H43823 Vitreomacular adhesion, bilateral: Secondary | ICD-10-CM | POA: Diagnosis not present

## 2024-03-13 DIAGNOSIS — D3132 Benign neoplasm of left choroid: Secondary | ICD-10-CM | POA: Diagnosis not present

## 2024-06-26 DIAGNOSIS — Z1231 Encounter for screening mammogram for malignant neoplasm of breast: Secondary | ICD-10-CM | POA: Diagnosis not present

## 2024-06-26 LAB — HM MAMMOGRAPHY

## 2024-08-08 ENCOUNTER — Telehealth: Payer: Self-pay

## 2024-08-08 NOTE — Telephone Encounter (Signed)
 Patient is overdue for an appointment. Unable to [facility] for patient to call and schedule due to V[facility] being full. She answered the first time and the line disconnected. No answer on repeat call.

## 2024-08-14 ENCOUNTER — Telehealth: Payer: Self-pay

## 2024-08-14 NOTE — Telephone Encounter (Signed)
 Patient has not been seen at [facility]. They are on the list from [employer] that they need an appt. I have called them but was unable to leave a message. If they call back please get them scheduled to see a PCP. If they have a PCP elsewhere please tell them to let [facility] know and remove us  as PCP.
